# Patient Record
Sex: Female | Born: 1950 | Race: White | Hispanic: No | Marital: Married | Smoking: Former smoker
Health system: Southern US, Community
[De-identification: ages and names within clinical notes are randomized; demographics above are authoritative.]

## PROBLEM LIST (undated history)

## (undated) DIAGNOSIS — T7840XA Allergy, unspecified, initial encounter: Secondary | ICD-10-CM

## (undated) DIAGNOSIS — F419 Anxiety disorder, unspecified: Secondary | ICD-10-CM

## (undated) DIAGNOSIS — M1990 Unspecified osteoarthritis, unspecified site: Secondary | ICD-10-CM

## (undated) DIAGNOSIS — K219 Gastro-esophageal reflux disease without esophagitis: Secondary | ICD-10-CM

## (undated) DIAGNOSIS — I10 Essential (primary) hypertension: Secondary | ICD-10-CM

## (undated) DIAGNOSIS — F32A Depression, unspecified: Secondary | ICD-10-CM

## (undated) HISTORY — DX: Allergy, unspecified, initial encounter: T78.40XA

## (undated) HISTORY — DX: Anxiety disorder, unspecified: F41.9

## (undated) HISTORY — DX: Essential (primary) hypertension: I10

## (undated) HISTORY — DX: Depression, unspecified: F32.A

## (undated) HISTORY — DX: Unspecified osteoarthritis, unspecified site: M19.90

## (undated) HISTORY — DX: Gastro-esophageal reflux disease without esophagitis: K21.9

---

## 1993-01-03 HISTORY — PX: ABDOMINAL HYSTERECTOMY: SHX81

## 2013-01-03 HISTORY — PX: ROTATOR CUFF REPAIR: SHX139

## 2017-08-02 DIAGNOSIS — K219 Gastro-esophageal reflux disease without esophagitis: Secondary | ICD-10-CM | POA: Insufficient documentation

## 2017-08-02 DIAGNOSIS — M25562 Pain in left knee: Secondary | ICD-10-CM | POA: Insufficient documentation

## 2017-08-02 DIAGNOSIS — G8929 Other chronic pain: Secondary | ICD-10-CM | POA: Insufficient documentation

## 2017-08-02 DIAGNOSIS — F419 Anxiety disorder, unspecified: Secondary | ICD-10-CM | POA: Insufficient documentation

## 2017-08-02 DIAGNOSIS — H259 Unspecified age-related cataract: Secondary | ICD-10-CM | POA: Insufficient documentation

## 2017-08-21 LAB — COLOGUARD
Cologuard: NEGATIVE
Cologuard: POSITIVE — AB

## 2017-09-20 LAB — HM HEPATITIS C SCREENING LAB: HM Hepatitis Screen: NEGATIVE

## 2017-09-28 DIAGNOSIS — R590 Localized enlarged lymph nodes: Secondary | ICD-10-CM | POA: Insufficient documentation

## 2017-09-28 DIAGNOSIS — E559 Vitamin D deficiency, unspecified: Secondary | ICD-10-CM | POA: Insufficient documentation

## 2018-09-25 LAB — HM DEXA SCAN

## 2018-09-25 LAB — HEMOGLOBIN A1C: Hemoglobin A1C: 5.3

## 2018-10-02 DIAGNOSIS — E782 Mixed hyperlipidemia: Secondary | ICD-10-CM | POA: Insufficient documentation

## 2018-11-26 LAB — HM MAMMOGRAPHY

## 2019-04-16 DIAGNOSIS — I10 Essential (primary) hypertension: Secondary | ICD-10-CM | POA: Insufficient documentation

## 2019-11-19 ENCOUNTER — Other Ambulatory Visit: Payer: Self-pay | Admitting: Family Medicine

## 2019-11-19 DIAGNOSIS — Z1231 Encounter for screening mammogram for malignant neoplasm of breast: Secondary | ICD-10-CM

## 2019-12-02 ENCOUNTER — Ambulatory Visit: Payer: Self-pay

## 2019-12-02 ENCOUNTER — Encounter: Payer: Self-pay | Admitting: Physician Assistant

## 2019-12-02 ENCOUNTER — Other Ambulatory Visit: Payer: Self-pay

## 2019-12-02 ENCOUNTER — Ambulatory Visit (INDEPENDENT_AMBULATORY_CARE_PROVIDER_SITE_OTHER): Payer: Medicare PPO | Admitting: Physician Assistant

## 2019-12-02 VITALS — BP 120/80 | HR 66 | Temp 97.8°F | Resp 16 | Ht 64.0 in | Wt 189.0 lb

## 2019-12-02 DIAGNOSIS — M25561 Pain in right knee: Secondary | ICD-10-CM | POA: Diagnosis not present

## 2019-12-02 DIAGNOSIS — I10 Essential (primary) hypertension: Secondary | ICD-10-CM

## 2019-12-02 DIAGNOSIS — K219 Gastro-esophageal reflux disease without esophagitis: Secondary | ICD-10-CM

## 2019-12-02 DIAGNOSIS — R0683 Snoring: Secondary | ICD-10-CM

## 2019-12-02 DIAGNOSIS — G8929 Other chronic pain: Secondary | ICD-10-CM

## 2019-12-02 DIAGNOSIS — M25562 Pain in left knee: Secondary | ICD-10-CM

## 2019-12-02 DIAGNOSIS — E782 Mixed hyperlipidemia: Secondary | ICD-10-CM | POA: Diagnosis not present

## 2019-12-02 DIAGNOSIS — F419 Anxiety disorder, unspecified: Secondary | ICD-10-CM

## 2019-12-02 LAB — COMPREHENSIVE METABOLIC PANEL
ALT: 24 U/L (ref 0–35)
AST: 27 U/L (ref 0–37)
Albumin: 4.2 g/dL (ref 3.5–5.2)
Alkaline Phosphatase: 84 U/L (ref 39–117)
BUN: 20 mg/dL (ref 6–23)
CO2: 29 mEq/L (ref 19–32)
Calcium: 9.1 mg/dL (ref 8.4–10.5)
Chloride: 101 mEq/L (ref 96–112)
Creatinine, Ser: 0.89 mg/dL (ref 0.40–1.20)
GFR: 65.36 mL/min (ref >60.00)
Glucose, Bld: 97 mg/dL (ref 70–99)
Potassium: 3.6 mEq/L (ref 3.5–5.1)
Sodium: 140 mEq/L (ref 135–145)
Total Bilirubin: 0.7 mg/dL (ref 0.2–1.2)
Total Protein: 6.6 g/dL (ref 6.0–8.3)

## 2019-12-02 LAB — LIPID PANEL
Cholesterol: 176 mg/dL (ref 0–200)
HDL: 50.6 mg/dL (ref >39.00)
LDL Cholesterol: 95 mg/dL (ref 0–99)
NonHDL: 125.14
Total CHOL/HDL Ratio: 3
Triglycerides: 153 mg/dL — ABNORMAL HIGH (ref 0.0–149.0)
VLDL: 30.6 mg/dL (ref 0.0–40.0)

## 2019-12-02 MED ORDER — CITALOPRAM HYDROBROMIDE 20 MG PO TABS: 20.0000 mg | ORAL_TABLET | Freq: Every day | ORAL | 1 refills | Status: DC

## 2019-12-02 MED ORDER — PRAVASTATIN SODIUM 20 MG PO TABS: 20.0000 mg | ORAL_TABLET | Freq: Every day | ORAL | 1 refills | Status: DC

## 2019-12-02 MED ORDER — OMEPRAZOLE 20 MG PO CPDR: 20.0000 mg | DELAYED_RELEASE_CAPSULE | Freq: Every day | ORAL | 1 refills | Status: DC

## 2019-12-02 MED ORDER — HYDROCHLOROTHIAZIDE 25 MG PO TABS: 25.0000 mg | ORAL_TABLET | Freq: Every day | ORAL | 1 refills | Status: AC

## 2019-12-02 MED ORDER — CELECOXIB 100 MG PO CAPS: 100.0000 mg | ORAL_CAPSULE | Freq: Two times a day (BID) | ORAL | 1 refills | Status: DC

## 2019-12-02 NOTE — Progress Notes (Signed)
Patient presents to clinic today to establish care.  She and her husband recently moved here from the [place].  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  Patient in need of medication refills.  Acute Concerns: Patient endorses ongoing issue with severe snoring.  Notes she snores so loud that her husband would not stay in the same room with her.  Patient and husband both deny apneic episodes or daytime somnolence.  Chronic Issues: Hypertension --patient endorses longstanding history.  Denies any history of heart attack or coronary artery disease.  Is currently on a regimen of hydrochlorothiazide 25 mg daily.  Also takes pravastatin 20 mg daily for history of borderline cholesterol.  Endorses taking as directed and tolerating well. Patient denies chest pain, palpitations, lightheadedness, dizziness, vision changes or frequent headaches.  GAD  --patient currently on a regimen of citalopram 20 mg daily.  Endorses taking as directed and tolerating very well with stable mood and good control of her anxiety symptoms.  Patient states she has come off of this medication in the past but always has to restart due to anxiety levels.  Denies any suicidal thoughts or ideation.  Osteoarthritis of knees --bilateral.  Is followed by orthopedics still in [facility] where she gets injections.  Is on a regimen of meloxicam 7.5 mg daily but has been out of medication.  Is wanting to know if this can be refilled today.  GERD --patient taking omeprazole 20 mg daily.  Notes as long as she takes the medicine she has good relief of symptoms.  Cannot go more than a day without her medication.  Denies any history of peptic ulcer disease.  Denies melena or hematochezia.  Notes is up-to-date on colorectal cancer screening.   Past Medical History:  Diagnosis Date   Allergy    Anxiety    Arthritis    Depression    GERD (gastroesophageal reflux disease)    Hypertension     Past  Surgical History:  Procedure Laterality Date   ABDOMINAL HYSTERECTOMY  1995   ROTATOR CUFF REPAIR  2015    Current Outpatient Medications on File Prior to Visit  Medication Sig Dispense Refill   citalopram (CELEXA) 20 MG tablet Take 1 tablet by mouth daily.     hydrochlorothiazide (HYDRODIURIL) 25 MG tablet Take 25 mg by mouth daily.      loratadine (CLARITIN) 10 MG tablet Take 1 tablet by mouth daily.     meloxicam (MOBIC) 7.5 MG tablet Take 1 tablet by mouth daily.     omeprazole (PRILOSEC) 20 MG capsule Take 1 tablet by mouth daily.     pravastatin (PRAVACHOL) 20 MG tablet Take 20 mg by mouth daily.      No current facility-administered medications on file prior to visit.    No Known Allergies  Family History  Problem Relation Age of Onset   Arthritis Mother    Hypertension Mother    Alcohol abuse Father    Cancer Father    Early death Father    Cirrhosis Father    Arthritis Brother    Hyperlipidemia Brother    Hypertension Brother    Stroke Maternal Grandmother    Heart attack Maternal Grandfather    Heart disease Maternal Grandfather    Stroke Paternal Grandmother    Heart attack Paternal Grandfather     Social History   Socioeconomic History   Marital status: Married    Spouse name: Not on file  Number of children: 1   Years of education: Not on file   Highest education level: Not on file  Occupational History   Occupation: Chaplain    Employer: [employer]  Tobacco Use   Smoking status: Former Smoker    Types: Cigarettes   Smokeless tobacco: Never Used  Scientific laboratory technician Use: Never used  Substance and Sexual Activity   Alcohol use: Yes    Alcohol/week: 2.0 standard drinks    Types: 2 Glasses of wine per week   Drug use: Never   Sexual activity: Not Currently    Birth control/protection: Surgical  Other Topics Concern   Not on file  Social History Narrative   Not on file   Social Determinants of Health    Financial Resource Strain:    Difficulty of Paying Living Expenses: Not on file  Food Insecurity:    Worried About [place] in the Last Year: Not on file   Ran Out of Food in the Last Year: Not on file  Transportation Needs:    Lack of Transportation (Medical): Not on file   Lack of Transportation (Non-Medical): Not on file  Physical Activity:    Days of Exercise per Week: Not on file   Minutes of Exercise per Session: Not on file  Stress:    Feeling of Stress : Not on file  Social Connections:    Frequency of Communication with Friends and Family: Not on file   Frequency of Social Gatherings with Friends and Family: Not on file   Attends Religious Services: Not on file   Active Member of Clubs or Organizations: Not on file   Attends Archivist Meetings: Not on file   Marital Status: Not on file  Intimate Partner Violence:    Fear of Current or Ex-Partner: Not on file   Emotionally Abused: Not on file   Physically Abused: Not on file   Sexually Abused: Not on file   ROS pertinent ROS are listed in the HPI.  BP 120/80    Pulse 66    Temp 97.8 F (36.6 C) (Temporal)    Resp 16    Ht 5' 4"  (1.626 m)    Wt 189 lb (85.7 kg)    SpO2 99%    BMI 32.44 kg/m   Physical Exam Vitals reviewed.  Constitutional:      Appearance: Normal appearance.  HENT:     Head: Normocephalic and atraumatic.  Cardiovascular:     Rate and Rhythm: Normal rate and regular rhythm.     Pulses: Normal pulses.     Heart sounds: Normal heart sounds.  Pulmonary:     Effort: Pulmonary effort is normal.     Breath sounds: Normal breath sounds.  Musculoskeletal:     Cervical back: Neck supple.  Neurological:     General: No focal deficit present.     Mental Status: She is alert.  Psychiatric:        Mood and Affect: Mood normal.    Assessment/Plan: 1. Essential hypertension BP normotensive.  Asymptomatic.  Will refill hydrochlorothiazide 25 mg daily.  Will  obtain fasting lipid panel and CMP today. - hydrochlorothiazide (HYDRODIURIL) 25 MG tablet; Take 1 tablet (25 mg total) by mouth daily.  Dispense: 90 tablet; Refill: 1 - Comp Met (CMET) - Lipid Profile  2. Mixed hyperlipidemia Taking statin as directed.  Dietary and exercise recommendations reviewed.  1 year since last cholesterol check.  Order placed. - pravastatin (PRAVACHOL)  20 MG tablet; Take 1 tablet (20 mg total) by mouth daily.  Dispense: 90 tablet; Refill: 1 - Lipid Profile  3. Gastroesophageal reflux disease without esophagitis Controlled with current regimen.  Suspect that the meloxicam has been contributing to her GERD symptoms.  We have switched to Celebrex.  Hopefully will be able to reduce dosing of her omeprazole.  Follow-up scheduled. - omeprazole (PRILOSEC) 20 MG capsule; Take 1 capsule (20 mg total) by mouth daily.  Dispense: 90 capsule; Refill: 1  4. Chronic pain of both knees Stop meloxicam.  Rx Celebrex 100 mg twice daily.  Supportive measures discussed.  Follow-up scheduled.  5. Anxiety Doing well on current regimen.  Medications refilled.  Follow-up every 6 months. - citalopram (CELEXA) 20 MG tablet; Take 1 tablet (20 mg total) by mouth daily.  Dispense: 90 tablet; Refill: 1  6. Snoring Without any noted apneic episodes.  We will first refer to ENT for evaluation.  If unremarkable will need sleep study.  Patient voiced understanding and agreement with the plan. - Ambulatory referral to ENT  This visit occurred during the SARS-CoV-2 public health emergency.  Safety protocols were in place, including screening questions prior to the visit, additional usage of staff PPE, and extensive cleaning of exam room while observing appropriate contact time as indicated for disinfecting solutions.     Leeanne Rio, PA-C

## 2019-12-02 NOTE — Patient Instructions (Signed)
Please go to the lab today for blood work.  I will call you with your results. We will alter treatment regimen(s) if indicated by your results.   I have sent in refills of your medication with the following exception:  - We are stopping the Meloxicam and switching to the Celebrex instead as this is better on your system.   I will get prior records to review. Schedule your CPE at your earliest convenience.   It was very nice meeting you today. Welcome to [facility]!

## 2020-01-10 ENCOUNTER — Other Ambulatory Visit: Payer: Self-pay

## 2020-01-10 ENCOUNTER — Ambulatory Visit (INDEPENDENT_AMBULATORY_CARE_PROVIDER_SITE_OTHER): Payer: Medicare PPO | Admitting: Otolaryngology

## 2020-01-10 ENCOUNTER — Encounter (INDEPENDENT_AMBULATORY_CARE_PROVIDER_SITE_OTHER): Payer: Self-pay | Admitting: Otolaryngology

## 2020-01-10 VITALS — Temp 96.4°F

## 2020-01-10 DIAGNOSIS — R0683 Snoring: Secondary | ICD-10-CM | POA: Diagnosis not present

## 2020-01-10 DIAGNOSIS — J310 Chronic rhinitis: Secondary | ICD-10-CM | POA: Diagnosis not present

## 2020-01-10 NOTE — Progress Notes (Addendum)
HPI: Diane Harris is a 70 y.o. female who presents is referred by her PCP for evaluation of snoring.  She denies trouble sleeping at night.  She feels like she gets a good night sleep and is not tired during the daytime.  However she apparently snores badly to the degree that her husband has not slept with her for the past 2 to 3 years.  Over the past 2 years she has gained about 20 to 25 pounds of weight.  She does have history of allergies and intermittent nasal congestion.  She uses Flonase intermittently.  She has used Claritin regularly..  She presents here to discuss any options for her snoring.  Past Medical History:  Diagnosis Date  . Allergy   . Anxiety   . Arthritis   . Depression   . GERD (gastroesophageal reflux disease)   . Hypertension    Past Surgical History:  Procedure Laterality Date  . ABDOMINAL HYSTERECTOMY  1995  . ROTATOR CUFF REPAIR  2015   Social History   Socioeconomic History  . Marital status: Married    Spouse name: Not on file  . Number of children: 1  . Years of education: Not on file  . Highest education level: Not on file  Occupational History  . Occupation: Physicist, medical: [employer]  Tobacco Use  . Smoking status: Former Smoker    Packs/day: 0.50    Years: 15.00    Pack years: 7.50    Types: Cigarettes  . Smokeless tobacco: Never Used  Vaping Use  . Vaping Use: Never used  Substance and Sexual Activity  . Alcohol use: Yes    Alcohol/week: 2.0 standard drinks    Types: 2 Glasses of wine per week  . Drug use: Never  . Sexual activity: Not Currently    Birth control/protection: Surgical  Other Topics Concern  . Not on file  Social History Narrative  . Not on file   Social Determinants of Health   Financial Resource Strain: Not on file  Food Insecurity: Not on file  Transportation Needs: Not on file  Physical Activity: Not on file  Stress: Not on file  Social Connections: Not on file   Family History  Problem Relation  Age of Onset  . Arthritis Mother   . Hypertension Mother   . Alcohol abuse Father   . Cancer Father   . Early death Father   . Cirrhosis Father   . Arthritis Brother   . Hyperlipidemia Brother   . Hypertension Brother   . Stroke Maternal Grandmother   . Heart attack Maternal Grandfather   . Heart disease Maternal Grandfather   . Stroke Paternal Grandmother   . Heart attack Paternal Grandfather    No Known Allergies Prior to Admission medications   Medication Sig Start Date End Date Taking? Authorizing Provider  celecoxib (CELEBREX) 100 MG capsule Take 1 capsule (100 mg total) by mouth 2 (two) times daily. 12/02/19   Waldon Merl, PA-C  citalopram (CELEXA) 20 MG tablet Take 1 tablet (20 mg total) by mouth daily. 12/02/19   Waldon Merl, PA-C  hydrochlorothiazide (HYDRODIURIL) 25 MG tablet Take 1 tablet (25 mg total) by mouth daily. 12/02/19   Waldon Merl, PA-C  loratadine (CLARITIN) 10 MG tablet Take 1 tablet by mouth daily. 11/18/19   [provider]  omeprazole (PRILOSEC) 20 MG capsule Take 1 capsule (20 mg total) by mouth daily. 12/02/19   Waldon Merl, PA-C  pravastatin (PRAVACHOL) 20 MG tablet Take 1 tablet (20 mg total) by mouth daily. 12/02/19   Waldon Merl, PA-C     Positive ROS: Otherwise negative  All other systems have been reviewed and were otherwise negative with the exception of those mentioned in the HPI and as above.  Physical Exam: Constitutional: Alert, well-appearing, no acute distress Ears: External ears without lesions or tenderness. Ear canals are clear bilaterally with intact, clear TMs.  Nasal: External nose without lesions. Septum mildly deviated with moderate rhinitis.  After decongesting the nose there are no polyps or obstructing lesions noted within the nasal cavity.  She has mild nasal congestion.  Both middle meatus regions are clear..  Oral: Lips and gums without lesions. Tongue and palate mucosa without lesions.  Posterior oropharynx clear.  Of note she has a strong gag reflex with a large tongue base.  Tonsils and uvula minimal size. Neck: No palpable adenopathy or masses Respiratory: Breathing comfortably  Skin: No facial/neck lesions or rash noted.  Procedures  Assessment: Snoring with no clinical symptoms of obstructive sleep apnea Chronic rhinitis  Plan: Discussed with her concerning initially use of Afrin and Breathe Right strips to maximize the nasal passages to see if this helps with snoring.  If this is effective at reducing snoring could consider surgical intervention which would involve septoplasty and turbinate reductions. Also was briefly discussed possible split or mandibular repositioning to help with snoring as she does have a large tongue base and strong gag reflex and gave her the name of Dr. Irene Limbo Recommended regular use of either Nasacort or Flonase 2 sprays each nostril at night as this will help with some nasal congestion. Also discussed that weight loss would improve snoring.   Narda Bonds, MD   CC:

## 2020-01-16 ENCOUNTER — Ambulatory Visit: Payer: Self-pay

## 2020-01-23 ENCOUNTER — Encounter: Payer: Self-pay | Admitting: Physician Assistant

## 2020-01-24 ENCOUNTER — Ambulatory Visit: Payer: Medicare PPO | Admitting: Physician Assistant

## 2020-01-24 ENCOUNTER — Encounter: Payer: Self-pay | Admitting: Physician Assistant

## 2020-01-24 ENCOUNTER — Other Ambulatory Visit: Payer: Self-pay

## 2020-01-24 ENCOUNTER — Ambulatory Visit (INDEPENDENT_AMBULATORY_CARE_PROVIDER_SITE_OTHER): Payer: Medicare PPO

## 2020-01-24 VITALS — BP 120/80 | HR 76 | Temp 97.8°F | Resp 16 | Ht 64.0 in | Wt 192.0 lb

## 2020-01-24 DIAGNOSIS — R600 Localized edema: Secondary | ICD-10-CM | POA: Diagnosis not present

## 2020-01-24 DIAGNOSIS — M79662 Pain in left lower leg: Secondary | ICD-10-CM

## 2020-01-24 DIAGNOSIS — M7989 Other specified soft tissue disorders: Secondary | ICD-10-CM

## 2020-01-24 NOTE — Progress Notes (Signed)
Patient presents to clinic today c/o pain and discomfort in her left calf muscle over the past several days.  Notes is more lateral and is worse at night when she is laying in bed.  Has noted some mild swelling.  Does note chronic knee pain bilaterally but denies any exacerbation of this.  Denies any recent prolonged immobilization or history of blood clot.  Denies any noted skin changes.  Has been taking Celebrex with some improvement in her symptoms.  Past Medical History:  Diagnosis Date  . Allergy   . Anxiety   . Arthritis   . Depression   . GERD (gastroesophageal reflux disease)   . Hypertension     Current Outpatient Medications on File Prior to Visit  Medication Sig Dispense Refill  . celecoxib (CELEBREX) 100 MG capsule Take 1 capsule (100 mg total) by mouth 2 (two) times daily. 60 capsule 1  . citalopram (CELEXA) 20 MG tablet Take 1 tablet (20 mg total) by mouth daily. 90 tablet 1  . hydrochlorothiazide (HYDRODIURIL) 25 MG tablet Take 1 tablet (25 mg total) by mouth daily. 90 tablet 1  . loratadine (CLARITIN) 10 MG tablet Take 1 tablet by mouth daily.    Marland Kitchen omeprazole (PRILOSEC) 20 MG capsule Take 1 capsule (20 mg total) by mouth daily. 90 capsule 1  . pravastatin (PRAVACHOL) 20 MG tablet Take 1 tablet (20 mg total) by mouth daily. 90 tablet 1   No current facility-administered medications on file prior to visit.    No Known Allergies  Family History  Problem Relation Age of Onset  . Arthritis Mother   . Hypertension Mother   . Alcohol abuse Father   . Cancer Father   . Early death Father   . Cirrhosis Father   . Arthritis Brother   . Hyperlipidemia Brother   . Hypertension Brother   . Stroke Maternal Grandmother   . Heart attack Maternal Grandfather   . Heart disease Maternal Grandfather   . Stroke Paternal Grandmother   . Heart attack Paternal Grandfather     Social History   Socioeconomic History  . Marital status: Married    Spouse name: Not on file  .  Number of children: 1  . Years of education: Not on file  . Highest education level: Not on file  Occupational History  . Occupation: Scientist, physiological: [employer]  Tobacco Use  . Smoking status: Former Smoker    Packs/day: 0.50    Years: 15.00    Pack years: 7.50    Types: Cigarettes  . Smokeless tobacco: Never Used  Vaping Use  . Vaping Use: Never used  Substance and Sexual Activity  . Alcohol use: Yes    Alcohol/week: 2.0 standard drinks    Types: 2 Glasses of wine per week  . Drug use: Never  . Sexual activity: Not Currently    Birth control/protection: Surgical  Other Topics Concern  . Not on file  Social History Narrative  . Not on file   Social Determinants of Health   Financial Resource Strain: Not on file  Food Insecurity: Not on file  Transportation Needs: Not on file  Physical Activity: Not on file  Stress: Not on file  Social Connections: Not on file   Review of Systems - See HPI.  All other ROS are negative.  BP 120/80   Pulse 76   Temp 97.8 F (36.6 C) (Temporal)   Resp 16   Ht 5' 4"  (1.626 m)  Wt 192 lb (87.1 kg)   SpO2 98%   BMI 32.96 kg/m   Physical Exam Vitals reviewed.  Constitutional:      Appearance: Normal appearance.  HENT:     Head: Normocephalic and atraumatic.  Cardiovascular:     Rate and Rhythm: Normal rate and regular rhythm.     Pulses: Normal pulses.     Heart sounds: Normal heart sounds.     Comments: Left calf tenderness. Negative Homan sign. Multiple spider veins and varicosities noted on visual exam with LLE > RLE Pulmonary:     Effort: Pulmonary effort is normal.     Breath sounds: Normal breath sounds.  Musculoskeletal:     Cervical back: Neck supple.     Right lower leg: 1+ Pitting Edema present.     Left lower leg: 1+ Pitting Edema present.     Left ankle: Normal.  Neurological:     Mental Status: She is alert.  Psychiatric:        Mood and Affect: Mood normal.     Recent Results (from the past 2160  hour(s))  Comp Met (CMET)     Status: None   Collection Time: 12/02/19 11:32 AM  Result Value Ref Range   Sodium 140 135 - 145 mEq/L   Potassium 3.6 3.5 - 5.1 mEq/L   Chloride 101 96 - 112 mEq/L   CO2 29 19 - 32 mEq/L   Glucose, Bld 97 70 - 99 mg/dL   BUN 20 6 - 23 mg/dL   Creatinine, Ser 0.89 0.40 - 1.20 mg/dL   Total Bilirubin 0.7 0.2 - 1.2 mg/dL   Alkaline Phosphatase 84 39 - 117 U/L   AST 27 0 - 37 U/L   ALT 24 0 - 35 U/L   Total Protein 6.6 6.0 - 8.3 g/dL   Albumin 4.2 3.5 - 5.2 g/dL   GFR 65.36 >60.00 mL/min    Comment: Calculated using the CKD-EPI Creatinine Equation (2021)   Calcium 9.1 8.4 - 10.5 mg/dL  Lipid Profile     Status: Abnormal   Collection Time: 12/02/19 11:32 AM  Result Value Ref Range   Cholesterol 176 0 - 200 mg/dL    Comment: ATP III Classification       Desirable:  < 200 mg/dL               Borderline High:  200 - 239 mg/dL          High:  > = 240 mg/dL   Triglycerides 153.0 (H) 0.0 - 149.0 mg/dL    Comment: Normal:  <150 mg/dLBorderline High:  150 - 199 mg/dL   HDL 50.60 >39.00 mg/dL   VLDL 30.6 0.0 - 40.0 mg/dL   LDL Cholesterol 95 0 - 99 mg/dL   Total CHOL/HDL Ratio 3     Comment:                Men          Women1/2 Average Risk     3.4          3.3Average Risk          5.0          4.42X Average Risk          9.6          7.13X Average Risk          15.0          11.0  NonHDL 125.14     Comment: NOTE:  Non-HDL goal should be 30 mg/dL higher than patient's LDL goal (i.e. LDL goal of < 70 mg/dL, would have non-HDL goal of < 100 mg/dL)   Assessment/Plan: 1. Pain and swelling of left lower leg Examination significant for spider veins and varicosities noted bilaterally, left greater than right.  Suspect venous insufficiency is cause of her symptoms but want to rule out clot.  Will obtain ultrasound to rule out DVT.  If positive will treat accordingly.  If negative will plan for compression stockings, low-salt diet and consideration of  fluid pill versus vascular surgery assessment. - US Venous Img Lower Unilateral Left; Future  This visit occurred during the SARS-CoV-2 public health emergency.  Safety protocols were in place, including screening questions prior to the visit, additional usage of staff PPE, and extensive cleaning of exam room while observing appropriate contact time as indicated for disinfecting solutions.     Leeanne Rio, PA-C

## 2020-01-24 NOTE — Patient Instructions (Addendum)
Keep legs elevated while resting.  Keep a low salt diet.  You can use OTC Tylenol arthritis along with the celebrex.  Speak with Alcario Drought about your Ultrasound. We want to r/o clot first. If positive we will start appropriate treatment. If negative suspect symptoms combination of venous insufficiency and muscular tension and will treat accordingly.   They should call me with your [place] results before they let you leave the imaging center.   Hang in there!

## 2020-02-03 ENCOUNTER — Other Ambulatory Visit: Payer: Self-pay | Admitting: Physician Assistant

## 2020-02-06 ENCOUNTER — Telehealth (INDEPENDENT_AMBULATORY_CARE_PROVIDER_SITE_OTHER): Payer: Medicare PPO | Admitting: Family Medicine

## 2020-02-06 DIAGNOSIS — M79606 Pain in leg, unspecified: Secondary | ICD-10-CM | POA: Diagnosis not present

## 2020-02-06 NOTE — Progress Notes (Signed)
Virtual Visit via Video Note  I connected with Diane Harris  on 02/06/20 at 12:20 PM EST by a video enabled telemedicine application and verified that I am speaking with the correct person using two identifiers.  Location patient: home, [name] Location provider:work or home office Persons participating in the virtual visit: patient, provider  I discussed the limitations of evaluation and management by telemedicine and the availability of in person appointments. The patient expressed understanding and agreed to proceed.   HPI:  Acute telemedicine visit for L leg pain: -Onset: 2-3 weeks ago -has a history of bad knees and OA and baker's cyst - used to see a sports medicine in [place] -Symptoms include:dull achy deep pain L lower lateral leg and L ankle - different from any pain she has felt in the past and is "scary" for her -saw PCP and had [place] which r/o DVT and venous issues -Denies:redness, swelling, warmth, hx of trauma or change in footwear, change in activity level, weakness, numbness, malaise or fevers, sensation to move, cramps -getting out of car and up stairs is the worst -hurts more when in bed, less once up and around -Has tried: takes celebrex for her knees and tylenol and that helps minimally  ROS: See pertinent positives and negatives per HPI.  Past Medical History:  Diagnosis Date  . Allergy   . Anxiety   . Arthritis   . Depression   . GERD (gastroesophageal reflux disease)   . Hypertension     Past Surgical History:  Procedure Laterality Date  . ABDOMINAL HYSTERECTOMY  1995  . ROTATOR CUFF REPAIR  2015     Current Outpatient Medications:  .  celecoxib (CELEBREX) 100 MG capsule, TAKE 1 CAPSULE BY MOUTH TWICE A DAY, Disp: 60 capsule, Rfl: 1 .  citalopram (CELEXA) 20 MG tablet, Take 1 tablet (20 mg total) by mouth daily., Disp: 90 tablet, Rfl: 1 .  hydrochlorothiazide (HYDRODIURIL) 25 MG tablet, Take 1 tablet (25 mg total) by mouth daily., Disp: 90 tablet, Rfl: 1 .   loratadine (CLARITIN) 10 MG tablet, Take 1 tablet by mouth daily., Disp: , Rfl:  .  omeprazole (PRILOSEC) 20 MG capsule, Take 1 capsule (20 mg total) by mouth daily., Disp: 90 capsule, Rfl: 1 .  pravastatin (PRAVACHOL) 20 MG tablet, Take 1 tablet (20 mg total) by mouth daily., Disp: 90 tablet, Rfl: 1  EXAM:  VITALS per patient if applicable:  GENERAL: alert, oriented, appears well and in no acute distress  HEENT: atraumatic, conjunttiva clear, no obvious abnormalities on inspection of external nose and ears  NECK: normal movements of the head and neck  LUNGS: on inspection no signs of respiratory distress, breathing rate appears normal, no obvious gross SOB, gasping or wheezing  CV: no obvious cyanosis  MS: moves all visible extremities without noticeable abnormality  PSYCH/NEURO: pleasant and cooperative, no obvious depression or anxiety, speech and thought processing grossly intact  ASSESSMENT AND PLAN:  Discussed the following assessment and plan:  Pain of lower extremity, unspecified laterality  -we discussed possible serious and likely etiologies, options for evaluation and workup, limitations of telemedicine visit vs in person visit, treatment, treatment risks and precautions. Pt prefers to treat via telemedicine empirically rather than in person at this moment.  After lengthy discussion,  she has opted for evaluation with an orthopedic specialist.  She is using a Celebrex and Tylenol for pain.  She decided to try an orthopedic urgent care today, rather than wait on a referral.  Discussed various  options and she agrees to seek a visit on her own. Scheduled follow up with PCP offered: Agrees to call her PCP office if follow-up as needed after evaluation with Ortho. Advised to seek prompt in person care if worsening, new symptoms arise, or if is not improving with treatment. Discussed options for inperson care if PCP office not available. Did let this patient know that I only do  telemedicine on Tuesdays and Thursdays for [place]. Advised to schedule follow up visit with PCP or [facility] if any further questions or concerns to avoid delays in care.   I discussed the assessment and treatment plan with the patient. The patient was provided an opportunity to ask questions and all were answered. The patient agreed with the plan and demonstrated an understanding of the instructions.     Terressa Koyanagi, DO

## 2020-03-29 ENCOUNTER — Other Ambulatory Visit: Payer: Self-pay | Admitting: Family Medicine

## 2020-06-03 ENCOUNTER — Other Ambulatory Visit: Payer: Self-pay

## 2020-06-03 DIAGNOSIS — F419 Anxiety disorder, unspecified: Secondary | ICD-10-CM

## 2020-06-03 MED ORDER — CELECOXIB 100 MG PO CAPS: 100.0000 mg | ORAL_CAPSULE | Freq: Two times a day (BID) | ORAL | 0 refills | Status: AC

## 2020-06-11 ENCOUNTER — Other Ambulatory Visit: Payer: Self-pay

## 2020-06-11 DIAGNOSIS — F419 Anxiety disorder, unspecified: Secondary | ICD-10-CM

## 2020-06-11 DIAGNOSIS — K219 Gastro-esophageal reflux disease without esophagitis: Secondary | ICD-10-CM

## 2020-06-11 MED ORDER — OMEPRAZOLE 20 MG PO CPDR: 20.0000 mg | DELAYED_RELEASE_CAPSULE | Freq: Every day | ORAL | 0 refills | Status: AC

## 2020-06-11 MED ORDER — CITALOPRAM HYDROBROMIDE 20 MG PO TABS
20.0000 mg | ORAL_TABLET | Freq: Every day | ORAL | 0 refills | Status: AC
Start: 2020-06-11 — End: ?

## 2020-06-30 ENCOUNTER — Other Ambulatory Visit: Payer: Self-pay

## 2020-06-30 ENCOUNTER — Inpatient Hospital Stay (HOSPITAL_COMMUNITY)
Admission: EM | Admit: 2020-06-30 | Discharge: 2020-07-02 | DRG: 103 | Disposition: A | Discharge status: Home / Self Care | Payer: Medicare PPO | Attending: Neurology | Admitting: Neurology

## 2020-06-30 ENCOUNTER — Inpatient Hospital Stay (HOSPITAL_COMMUNITY): Payer: Medicare PPO

## 2020-06-30 ENCOUNTER — Emergency Department (HOSPITAL_COMMUNITY): Payer: Medicare PPO

## 2020-06-30 ENCOUNTER — Encounter (HOSPITAL_COMMUNITY): Payer: Self-pay | Admitting: Emergency Medicine

## 2020-06-30 DIAGNOSIS — F32A Depression, unspecified: Secondary | ICD-10-CM | POA: Diagnosis present

## 2020-06-30 DIAGNOSIS — D72829 Elevated white blood cell count, unspecified: Secondary | ICD-10-CM | POA: Diagnosis present

## 2020-06-30 DIAGNOSIS — Z79899 Other long term (current) drug therapy: Secondary | ICD-10-CM

## 2020-06-30 DIAGNOSIS — R4701 Aphasia: Secondary | ICD-10-CM

## 2020-06-30 DIAGNOSIS — Z83438 Family history of other disorder of lipoprotein metabolism and other lipidemia: Secondary | ICD-10-CM

## 2020-06-30 DIAGNOSIS — G43109 Migraine with aura, not intractable, without status migrainosus: Secondary | ICD-10-CM | POA: Diagnosis not present

## 2020-06-30 DIAGNOSIS — Z87891 Personal history of nicotine dependence: Secondary | ICD-10-CM | POA: Diagnosis not present

## 2020-06-30 DIAGNOSIS — Z9282 Status post administration of tPA (rtPA) in a different facility within the last 24 hours prior to admission to current facility: Secondary | ICD-10-CM | POA: Diagnosis not present

## 2020-06-30 DIAGNOSIS — K219 Gastro-esophageal reflux disease without esophagitis: Secondary | ICD-10-CM | POA: Diagnosis present

## 2020-06-30 DIAGNOSIS — R479 Unspecified speech disturbances: Secondary | ICD-10-CM | POA: Diagnosis not present

## 2020-06-30 DIAGNOSIS — Z20822 Contact with and (suspected) exposure to covid-19: Secondary | ICD-10-CM | POA: Diagnosis present

## 2020-06-30 DIAGNOSIS — E782 Mixed hyperlipidemia: Secondary | ICD-10-CM | POA: Diagnosis present

## 2020-06-30 DIAGNOSIS — Z823 Family history of stroke: Secondary | ICD-10-CM | POA: Diagnosis not present

## 2020-06-30 DIAGNOSIS — F419 Anxiety disorder, unspecified: Secondary | ICD-10-CM | POA: Diagnosis present

## 2020-06-30 DIAGNOSIS — I639 Cerebral infarction, unspecified: Secondary | ICD-10-CM | POA: Diagnosis not present

## 2020-06-30 DIAGNOSIS — E7800 Pure hypercholesterolemia, unspecified: Secondary | ICD-10-CM | POA: Diagnosis not present

## 2020-06-30 DIAGNOSIS — I10 Essential (primary) hypertension: Secondary | ICD-10-CM | POA: Diagnosis present

## 2020-06-30 DIAGNOSIS — Z8261 Family history of arthritis: Secondary | ICD-10-CM | POA: Diagnosis not present

## 2020-06-30 DIAGNOSIS — Z8249 Family history of ischemic heart disease and other diseases of the circulatory system: Secondary | ICD-10-CM | POA: Diagnosis not present

## 2020-06-30 DIAGNOSIS — I350 Nonrheumatic aortic (valve) stenosis: Secondary | ICD-10-CM | POA: Diagnosis not present

## 2020-06-30 LAB — RAPID URINE DRUG SCREEN, HOSP PERFORMED
Amphetamines: NOT DETECTED
Barbiturates: NOT DETECTED
Benzodiazepines: NOT DETECTED
Cocaine: NOT DETECTED
Opiates: NOT DETECTED
Tetrahydrocannabinol: NOT DETECTED

## 2020-06-30 LAB — I-STAT CHEM 8, ED
BUN: 12 mg/dL (ref 8–23)
Calcium, Ion: 1.07 mmol/L — ABNORMAL LOW (ref 1.15–1.40)
Chloride: 101 mmol/L (ref 98–111)
Creatinine, Ser: 0.8 mg/dL (ref 0.44–1.00)
Glucose, Bld: 84 mg/dL (ref 70–99)
HCT: 37 % (ref 36.0–46.0)
Hemoglobin: 12.6 g/dL (ref 12.0–15.0)
Potassium: 3 mmol/L — ABNORMAL LOW (ref 3.5–5.1)
Sodium: 137 mmol/L (ref 135–145)
TCO2: 28 mmol/L (ref 22–32)

## 2020-06-30 LAB — URINALYSIS, ROUTINE W REFLEX MICROSCOPIC
Bacteria, UA: NONE SEEN
Bilirubin Urine: NEGATIVE
Glucose, UA: NEGATIVE mg/dL
Hgb urine dipstick: NEGATIVE
Ketones, ur: NEGATIVE mg/dL
Nitrite: NEGATIVE
Protein, ur: NEGATIVE mg/dL
Specific Gravity, Urine: 1.032 — ABNORMAL HIGH (ref 1.005–1.030)
pH: 6 (ref 5.0–8.0)

## 2020-06-30 LAB — RESP PANEL BY RT-PCR (FLU A&B, COVID) ARPGX2
Influenza A by PCR: NEGATIVE
Influenza B by PCR: NEGATIVE
SARS Coronavirus 2 by RT PCR: NEGATIVE

## 2020-06-30 LAB — DIFFERENTIAL
Abs Immature Granulocytes: 0.11 10*3/uL — ABNORMAL HIGH (ref 0.00–0.07)
Basophils Absolute: 0.1 10*3/uL (ref 0.0–0.1)
Basophils Relative: 1 %
Eosinophils Absolute: 0.3 10*3/uL (ref 0.0–0.5)
Eosinophils Relative: 2 %
Immature Granulocytes: 1 %
Lymphocytes Relative: 20 %
Lymphs Abs: 2.5 10*3/uL (ref 0.7–4.0)
Monocytes Absolute: 0.9 10*3/uL (ref 0.1–1.0)
Monocytes Relative: 7 %
Neutro Abs: 8.4 10*3/uL — ABNORMAL HIGH (ref 1.7–7.7)
Neutrophils Relative %: 69 %

## 2020-06-30 LAB — CBG MONITORING, ED: Glucose-Capillary: 85 mg/dL (ref 70–99)

## 2020-06-30 LAB — COMPREHENSIVE METABOLIC PANEL
ALT: 22 U/L (ref 0–44)
AST: 22 U/L (ref 15–41)
Albumin: 4 g/dL (ref 3.5–5.0)
Alkaline Phosphatase: 96 U/L (ref 38–126)
Anion gap: 13 (ref 5–15)
BUN: 10 mg/dL (ref 8–23)
CO2: 25 mmol/L (ref 22–32)
Calcium: 9.1 mg/dL (ref 8.9–10.3)
Chloride: 98 mmol/L (ref 98–111)
Creatinine, Ser: 0.8 mg/dL (ref 0.44–1.00)
GFR, Estimated: >60 mL/min (ref >60)
Glucose, Bld: 84 mg/dL (ref 70–99)
Potassium: 3 mmol/L — ABNORMAL LOW (ref 3.5–5.1)
Sodium: 136 mmol/L (ref 135–145)
Total Bilirubin: 0.9 mg/dL (ref 0.3–1.2)
Total Protein: 6.7 g/dL (ref 6.5–8.1)

## 2020-06-30 LAB — CBC
HCT: 40.1 % (ref 36.0–46.0)
Hemoglobin: 12.6 g/dL (ref 12.0–15.0)
MCH: 26.4 pg (ref 26.0–34.0)
MCHC: 31.4 g/dL (ref 30.0–36.0)
MCV: 83.9 fL (ref 80.0–100.0)
Platelets: 244 10*3/uL (ref 150–400)
RBC: 4.78 MIL/uL (ref 3.87–5.11)
RDW: 13.9 % (ref 11.5–15.5)
WBC: 12.2 10*3/uL — ABNORMAL HIGH (ref 4.0–10.5)
nRBC: 0 % (ref 0.0–0.2)

## 2020-06-30 LAB — PROTIME-INR
INR: 0.9 (ref 0.8–1.2)
Prothrombin Time: 12.5 seconds (ref 11.4–15.2)

## 2020-06-30 LAB — APTT: aPTT: 31 seconds (ref 24–36)

## 2020-06-30 LAB — ETHANOL: Alcohol, Ethyl (B): <10 mg/dL (ref <10)

## 2020-06-30 MED ORDER — PRAVASTATIN SODIUM 10 MG PO TABS
20.0000 mg | ORAL_TABLET | Freq: Every day | ORAL | Status: DC
  Administered 2020-07-01: 20 mg via ORAL
  Filled 2020-06-30: qty 2

## 2020-06-30 MED ORDER — CHLORHEXIDINE GLUCONATE CLOTH 2 % EX PADS
6.0000 | MEDICATED_PAD | Freq: Every day | CUTANEOUS | Status: DC
  Administered 2020-07-01: 6 via TOPICAL

## 2020-06-30 MED ORDER — ACETAMINOPHEN 160 MG/5ML PO SOLN: 650.0000 mg | ORAL | Status: DC | PRN

## 2020-06-30 MED ORDER — ACETAMINOPHEN 325 MG PO TABS
650.0000 mg | ORAL_TABLET | ORAL | Status: DC | PRN
  Administered 2020-07-01 – 2020-07-02 (×3): 650 mg via ORAL
  Filled 2020-06-30 (×3): qty 2

## 2020-06-30 MED ORDER — IOHEXOL 350 MG/ML SOLN
100.0000 mL | Freq: Once | INTRAVENOUS | Status: AC | PRN
  Administered 2020-06-30: 100 mL via INTRAVENOUS

## 2020-06-30 MED ORDER — ALTEPLASE (STROKE) FULL DOSE INFUSION
0.9000 mg/kg | Freq: Once | INTRAVENOUS | Status: AC
  Administered 2020-06-30: 83.7 mg via INTRAVENOUS

## 2020-06-30 MED ORDER — LABETALOL HCL 5 MG/ML IV SOLN
INTRAVENOUS | Status: AC
  Administered 2020-06-30: 10 mg via INTRAVENOUS
  Filled 2020-06-30: qty 4

## 2020-06-30 MED ORDER — ONDANSETRON HCL 4 MG/2ML IJ SOLN
4.0000 mg | Freq: Once | INTRAMUSCULAR | Status: AC
  Administered 2020-06-30: 4 mg via INTRAVENOUS
  Filled 2020-06-30: qty 2

## 2020-06-30 MED ORDER — SODIUM CHLORIDE 0.9 % IV SOLN: INTRAVENOUS | Status: DC

## 2020-06-30 MED ORDER — LABETALOL HCL 5 MG/ML IV SOLN: 10.0000 mg | INTRAVENOUS | Status: DC | PRN

## 2020-06-30 MED ORDER — HYDROCHLOROTHIAZIDE 25 MG PO TABS
25.0000 mg | ORAL_TABLET | Freq: Every day | ORAL | Status: DC
  Administered 2020-07-01: 25 mg via ORAL
  Filled 2020-06-30: qty 1

## 2020-06-30 MED ORDER — STROKE: EARLY STAGES OF RECOVERY BOOK: Freq: Once | Status: DC

## 2020-06-30 MED ORDER — LABETALOL HCL 5 MG/ML IV SOLN: 10.0000 mg | Freq: Once | INTRAVENOUS | Status: AC

## 2020-06-30 MED ORDER — PANTOPRAZOLE SODIUM 40 MG IV SOLR: 40.0000 mg | Freq: Every day | INTRAVENOUS | Status: DC

## 2020-06-30 MED ORDER — CITALOPRAM HYDROBROMIDE 10 MG PO TABS
20.0000 mg | ORAL_TABLET | Freq: Every day | ORAL | Status: DC
  Administered 2020-07-01: 20 mg via ORAL
  Filled 2020-06-30: qty 2

## 2020-06-30 MED ORDER — ACETAMINOPHEN 650 MG RE SUPP: 650.0000 mg | RECTAL | Status: DC | PRN

## 2020-06-30 MED ORDER — SENNOSIDES-DOCUSATE SODIUM 8.6-50 MG PO TABS: 1.0000 | ORAL_TABLET | Freq: Every evening | ORAL | Status: DC | PRN

## 2020-06-30 MED ORDER — LORATADINE 10 MG PO TABS
10.0000 mg | ORAL_TABLET | Freq: Every day | ORAL | Status: DC
  Administered 2020-07-01: 10 mg via ORAL
  Filled 2020-06-30: qty 1

## 2020-06-30 MED ORDER — SODIUM CHLORIDE 0.9 % IV SOLN
50.0000 mL | Freq: Once | INTRAVENOUS | Status: AC
  Administered 2020-06-30: 50 mL via INTRAVENOUS

## 2020-06-30 NOTE — ED Triage Notes (Signed)
Pt presents to ED POV. Pt c/o aphasia. Pt reports that arpox 1730 pt began not being able to say words that she knew. Pt noncompliant on bp med x4d. AAO x4. NIHH - 1

## 2020-06-30 NOTE — ED Notes (Signed)
Pt transferred to MRI

## 2020-06-30 NOTE — H&P (Signed)
Neurology H&P  CC: Difficulty speaking  History is obtained from: Patient and chart review   HPI: Diane Harris is a 70 y.o. woman with a PMHx significant for hypertension, anxiety/depression.  She works here as a Runner, broadcasting/film/video and reports she was last able to speak normally at 5:30 PM, leading to family and prior after the left flank had passed away. Shortly after she had difficulty expressing herself and was unable to write a note in the chart, but denies any new headache, visual symptoms, weakness or sensory symptoms.  She did have her typical headache, she gets headaches about once a month, typically in the setting of fatigue, which is occasionally associated with nausea and she feels may be related to sinus issues.  Her typical headache generally resolves with Tylenol within a few hours.  She notes significant stress in the setting of graduating August 18 and currently house hunting for her home back in [place].  Additionally she was out of her blood pressure medications for about 3 days but was able to restart them 2 days ago.  Risks and benefits were confirmed with the patient as well as her husband given her mild expressive aphasia.  Lack of any contraindications was confirmed with both parties and decision was made to proceed with tPA after both of them verbally consented.  Despite low NIH stroke scale given disabling effects of language difficulties, she was felt to be a good candidate for tPA  LKW: 17:30 PM tPA given?: Yes 19:43  Slight delay to obtain IV access and to administer labetalol (required only 1 dose of 10 mg) to achieve safe blood pressure prior to initiating medication IA performed?: No, no LVO Premorbid modified rankin scale:      0 - No symptoms.  ROS: All other review of systems was negative except as noted in the HPI.   Past Medical History:  Diagnosis Date   Allergy    Anxiety    Arthritis    Depression    GERD (gastroesophageal reflux  disease)    Hypertension    Past Surgical History:  Procedure Laterality Date   ABDOMINAL HYSTERECTOMY  1995   ROTATOR CUFF REPAIR  2015   Current Outpatient Medications  Medication Instructions   celecoxib (CELEBREX) 100 mg, Oral, 2 times daily   citalopram (CELEXA) 20 mg, Oral, Daily   hydrochlorothiazide (HYDRODIURIL) 25 mg, Oral, Daily   loratadine (CLARITIN) 10 MG tablet 1 tablet, Oral, Daily   omeprazole (PRILOSEC) 20 mg, Oral, Daily   pravastatin (PRAVACHOL) 20 mg, Oral, Daily  - Confimed no anticoagulation use with patient and husband  Family History  Problem Relation Age of Onset   Arthritis Mother    Hypertension Mother    Alcohol abuse Father    Cancer Father    Early death Father    Cirrhosis Father    Arthritis Brother    Hyperlipidemia Brother    Hypertension Brother    Stroke Maternal Grandmother    Heart attack Maternal Grandfather    Heart disease Maternal Grandfather    Stroke Paternal Grandmother    Heart attack Paternal Grandfather    Social History:  reports that she has quit smoking. Her smoking use included cigarettes. She has a 7.50 pack-year smoking history. She has never used smokeless tobacco. She reports current alcohol use of about 2.0 standard drinks of alcohol per week. She reports that she does not use drugs.   Exam: Current vital signs: BP (!) 170/101 (BP Location:  Right Arm)   Pulse 83   Resp (!) 22   Wt 93 kg   SpO2 96%   BMI 35.19 kg/m  Vital signs in last 24 hours: Pulse Rate:  [83] 83 (06/28 1911) Resp:  [22] 22 (06/28 1911) BP: (170)/(101) 170/101 (06/28 1911) SpO2:  [96 %] 96 % (06/28 1911) Weight:  [93 kg] 93 kg (06/28 1911)   Physical Exam  Constitutional: Appears well-developed and well-nourished.  Psych: Affect appropriate to situation, mildly anxious/frustrated by her communicative difficulties Eyes: No scleral injection HENT: No oropharyngeal obstruction.  MSK: no joint deformities.  Cardiovascular: Normal rate  and regular rhythm.  Respiratory: Effort normal, non-labored breathing GI: Soft.  No distension. There is no tenderness.  Skin: Warm dry and intact visible skin  Neuro: Mental Status: Patient is awake, alert, oriented to person, place, month, age, and situation. Patient is able to give a clear and coherent history, with some mild language errors No signs of neglect.  Mild difficulty naming with low-frequency objects and some difficulty with paraphasic errors on repetition (heart instead of hot for example) Cranial Nerves: II: Visual Fields are full. Pupils are equal, round, and reactive to light.   III,IV, VI: EOMI without ptosis or diploplia.  V: Facial sensation is symmetric to light touch VII: Facial movement is symmetric.  VIII: hearing is intact to voice X: Uvula elevates symmetrically XI: Shoulder shrug is symmetric. XII: tongue is midline without atrophy or fasciculations.  Motor: Tone is normal. Bulk is normal. 5/5 strength was present in all four extremities.  Sensory: Sensation is symmetric to light touch and temperature in the arms and legs. Deep Tendon Reflexes: 2+ and symmetric in the biceps and patellae.  Cerebellar: FNF and HKS are intact bilaterally Gait:  Steadily moves from wheelchair to scanner bed without leaning to or favoring either side   NIHSS total 1 Score breakdown: Mild aphasia  I have reviewed labs in epic and the results pertinent to this consultation are: Potassium 3.0, mild leukocytosis to 12.2, glucose 84, platelets 244  I have reviewed the images obtained: Head CT negative for acute intracranial process, there is some chronic microvascular disease, ASPECTS 10 CTA negative for acute occlusion  Impression: Stroke versus TIA of Broca's area in this patient with stroke risk factors including obesity, hypertension, age and remote smoking.  Already improving after tPA administration.  Less likely complicated migraine given no prior history of  language difficulties associated with her headaches.  Recommendations: # Possible Broca's area stroke versus TIA, s/p tPA - Stroke labs HgbA1c, fasting lipid panel - MRI brain  - Frequent neuro checks - Echocardiogram - Hold antiplatelet medications until 24 hours after tPA and then initiate if patient remains stable - Risk factor modification, diet, exercise, medication adherence - Telemetry monitoring;  - Blood pressure goal   - Post tPA for 24  hours < 180/105 - Speech consult, no need for PT/OT at this time unless patient declines - Patient will be an OPTIMIST trial candidate only if her blood pressure remains within goal and she remains clinically stable 2 hours post tPA administration (21:43) - NPO pending bedside swallow eval - Admit to stroke team  to follow  #Hypertension -Continue home HCTZ 25 mg daily  #Hyperlipidemia - Home statin is pravastatin 20 mg, increase to high intensity statin (atorvastatin 40 or 80 mg) as needed to meet LDL goal less than 70  #Other chronic home medications -Continue home citalopram 20 mg daily, pantoprazole 40 mg daily and loratadine  10 mg daily  DVT prophylaxis with SCDs GI prophylaxis with pantoprazole, senna as needed   Brooke Dare MD- PhD [facility] [phone]  Total critical care time: 70 minutes   Critical care time was exclusive of separately billable procedures and treating other patients.   Critical care was necessary to treat or prevent imminent or life-threatening deterioration.  Specifically, patient was being evaluated for acute onset neurological change with clinical concern for stroke meeting criteria for tPA administration   Critical care was time spent personally by me on the following activities: development of treatment plan with patient and/or surrogate as well as nursing, discussions with consultants/primary team, evaluation of patient's response to treatment, examination of patient, obtaining  history from patient or surrogate, ordering and performing treatments and interventions, ordering and review of laboratory studies, ordering and review of radiographic studies, and re-evaluation of patient's condition as needed, as documented above.

## 2020-06-30 NOTE — ED Notes (Signed)
Code Stroke Activated  

## 2020-06-30 NOTE — Code Documentation (Signed)
Stroke Response Nurse Documentation Code Documentation  Diane Harris is a 70 y.o. female arriving to [facility] ED on 06/30/2020 with past medical hx of HTN, anxiety and GERD. Code stroke was activated by ED.  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 On No antithrombotic. Stroke team at the bedside on patient arrival. Labs drawn and patient cleared for CT by Dr. Madilyn Hook. Patient to CT with team. NIHSS 1, see documentation for details and code stroke times. Patient with Expressive aphasia  on exam. The following imaging was completed: CT, CTA head and neck, CTP. BP 192/83  HR 77, 10mg  Labetalol given IV.  2 PIVs placed. Patient is a candidate for tPA.  tPA started at 1943.  Care/Plan. Bedside handoff with ED RN .  Modified NIHSS and VS q15 min x 2 hours then q32min x 6hrs  31m  Stroke Response RN

## 2020-06-30 NOTE — ED Provider Notes (Signed)
[facility] EMERGENCY DEPARTMENT Provider Note   CSN: [id number] Arrival date & time: 06/30/20  1907     History No chief complaint on file.   Diane Harris is a 70 y.o. female.  The history is provided by the patient.  Diane Harris is a 71 y.o. female who presents to the Emergency Department complaining of speech difficulties. She presents the emergency department for evaluation of speech difficulties. Last known well at 5 PM today. 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 She denies any additional symptoms. She has a history of hypertension.    Past Medical History:  Diagnosis Date   Allergy    Anxiety    Arthritis    Depression    GERD (gastroesophageal reflux disease)    Hypertension     Patient Active Problem List   Diagnosis Date Noted   Essential hypertension 04/16/2019   Mixed hyperlipidemia 10/02/2018   Axillary lymphadenopathy 09/28/2017   Vitamin D deficiency 09/28/2017   Age-related cataract of both eyes 08/02/2017   Anxiety 08/02/2017   Chronic pain of both knees 08/02/2017   Gastroesophageal reflux disease without esophagitis 08/02/2017    Past Surgical History:  Procedure Laterality Date   ABDOMINAL HYSTERECTOMY  1995   ROTATOR CUFF REPAIR  2015     OB History   No obstetric history on file.     Family History  Problem Relation Age of Onset   Arthritis Mother    Hypertension Mother    Alcohol abuse Father    Cancer Father    Early death Father    Cirrhosis Father    Arthritis Brother    Hyperlipidemia Brother    Hypertension Brother    Stroke Maternal Grandmother    Heart attack Maternal Grandfather    Heart disease Maternal Grandfather    Stroke Paternal Grandmother    Heart attack Paternal Grandfather     Social History   Tobacco Use   Smoking status: Former    Packs/day: 0.50    Years: 15.00    Pack years: 7.50    Types: Cigarettes   Smokeless tobacco: Never  Vaping Use   Vaping  Use: Never used  Substance Use Topics   Alcohol use: Yes    Alcohol/week: 2.0 standard drinks    Types: 2 Glasses of wine per week   Drug use: Never    Home Medications Prior to Admission medications   Medication Sig Start Date End Date Taking? Authorizing Provider  celecoxib (CELEBREX) 100 MG capsule Take 1 capsule (100 mg total) by mouth 2 (two) times daily. 06/03/20   Eulis Foster, FNP  citalopram (CELEXA) 20 MG tablet Take 1 tablet (20 mg total) by mouth daily. 06/11/20   Eulis Foster, FNP  hydrochlorothiazide (HYDRODIURIL) 25 MG tablet Take 1 tablet (25 mg total) by mouth daily. 12/02/19   Waldon Merl, PA-C  loratadine (CLARITIN) 10 MG tablet Take 1 tablet by mouth daily. 11/18/19   [provider]  omeprazole (PRILOSEC) 20 MG capsule Take 1 capsule (20 mg total) by mouth daily. 06/11/20   Eulis Foster, FNP  pravastatin (PRAVACHOL) 20 MG tablet Take 1 tablet (20 mg total) by mouth daily. 12/02/19   Waldon Merl, PA-C    Allergies    Patient has no known allergies.  Review of Systems   Review of Systems  All other systems reviewed and are negative.  Physical Exam Updated Vital Signs There were no vitals  taken for this visit.  Physical Exam Vitals and nursing note reviewed.  Constitutional:      Appearance: She is well-developed.  HENT:     Head: Normocephalic and atraumatic.  Cardiovascular:     Rate and Rhythm: Normal rate and regular rhythm.     Heart sounds: No murmur heard. Pulmonary:     Effort: Pulmonary effort is normal. No respiratory distress.     Breath sounds: Normal breath sounds.  Abdominal:     Palpations: Abdomen is soft.     Tenderness: There is no abdominal tenderness. There is no guarding or rebound.  Musculoskeletal:        General: No tenderness.  Skin:    General: Skin is warm and dry.  Neurological:     Mental Status: She is alert and oriented to person, place, and time.     Comments: No asymmetry of facial movements.  Visual fields grossly intact. Mild to moderate expressive aphasia. Five out of five strength in all four extremities with sensation to light touch intact in all four extremities  Psychiatric:     Comments: Anxious appearing    ED Results / Procedures / Treatments   Labs (all labs ordered are listed, but only abnormal results are displayed) Labs Reviewed  RESP PANEL BY RT-PCR (FLU A&B, COVID) ARPGX2  ETHANOL  PROTIME-INR  APTT  CBC  DIFFERENTIAL  COMPREHENSIVE METABOLIC PANEL  RAPID URINE DRUG SCREEN, HOSP PERFORMED  URINALYSIS, ROUTINE W REFLEX MICROSCOPIC  I-STAT CHEM 8, ED    EKG None  Radiology No results found.  Procedures Procedures  CRITICAL CARE Performed by: Tilden Fossa   Total critical care time: 30 minutes  Critical care time was exclusive of separately billable procedures and treating other patients.  Critical care was necessary to treat or prevent imminent or life-threatening deterioration.  Critical care was time spent personally by me on the following activities: development of treatment plan with patient and/or surrogate as well as nursing, discussions with consultants, evaluation of patient's response to treatment, examination of patient, obtaining history from patient or surrogate, ordering and performing treatments and interventions, ordering and review of laboratory studies, ordering and review of radiographic studies, pulse oximetry and re-evaluation of patient's condition.   Medications Ordered in ED Medications - No data to display  ED Course  I have reviewed the triage vital signs and the nursing notes.  Pertinent labs & imaging results that were available during my care of the patient were reviewed by me and considered in my medical decision making (see chart for details).    MDM Rules/Calculators/A&P                         patient here for evaluation of speech difficulties. Patient is a phasic on ED presentation. A code stroke was  activated. She was evaluated by neurology team with plan to admit for ongoing treatment. She was given TPA by neurology.  later during ED stay after TPA patient did complain of nausea, headache. She was treated with Zofran with improvement nausea. Plan to check repeat CT head.  Final Clinical Impression(s) / ED Diagnoses Final diagnoses:  None    Rx / DC Orders ED Discharge Orders     None        Tilden Fossa, MD 07/01/20 0002

## 2020-06-30 NOTE — Progress Notes (Signed)
   06/30/20 1850  Clinical Encounter Type  Visited With Patient and family together  Visit Type Initial;ED;Spiritual support;Social support  Referral From Patient  Consult/Referral To Chaplain  Spiritual Encounters  Spiritual Needs Prayer;Emotional   Chaplains [place] and [place] responded to call from Pt. Pt's husband, Greggory Stallion, is at bedside. Chaplains provided emotional and spiritual support. Chaplain Vincella prayed with Pt and Pt's husband. The on-call chaplain, Toni Amend, will check in as needed.   This note was prepared by Chaplain Resident, Tacy Learn, MDiv. Chaplain remains available as needed through the on-call pager: [phone].

## 2020-07-01 ENCOUNTER — Inpatient Hospital Stay (HOSPITAL_COMMUNITY): Payer: Medicare PPO

## 2020-07-01 DIAGNOSIS — E7800 Pure hypercholesterolemia, unspecified: Secondary | ICD-10-CM

## 2020-07-01 DIAGNOSIS — R479 Unspecified speech disturbances: Secondary | ICD-10-CM

## 2020-07-01 DIAGNOSIS — I350 Nonrheumatic aortic (valve) stenosis: Secondary | ICD-10-CM

## 2020-07-01 DIAGNOSIS — G43109 Migraine with aura, not intractable, without status migrainosus: Principal | ICD-10-CM

## 2020-07-01 DIAGNOSIS — I10 Essential (primary) hypertension: Secondary | ICD-10-CM

## 2020-07-01 LAB — ECHOCARDIOGRAM COMPLETE
AR max vel: 1.75 cm2
AV Area VTI: 1.87 cm2
AV Area mean vel: 1.72 cm2
AV Mean grad: 12 mmHg
AV Peak grad: 22.5 mmHg
Ao pk vel: 2.37 m/s
Area-P 1/2: 3.42 cm2
S' Lateral: 2.7 cm
Weight: 3280.44 oz

## 2020-07-01 LAB — LIPID PANEL
Cholesterol: 200 mg/dL (ref 0–200)
HDL: 55 mg/dL (ref >40)
LDL Cholesterol: 125 mg/dL — ABNORMAL HIGH (ref 0–99)
Total CHOL/HDL Ratio: 3.6 RATIO
Triglycerides: 102 mg/dL (ref <150)
VLDL: 20 mg/dL (ref 0–40)

## 2020-07-01 LAB — MRSA NEXT GEN BY PCR, NASAL: MRSA by PCR Next Gen: NOT DETECTED

## 2020-07-01 MED ORDER — PRAVASTATIN SODIUM 40 MG PO TABS: 40.0000 mg | ORAL_TABLET | Freq: Every day | ORAL | Status: DC

## 2020-07-01 MED ORDER — ASPIRIN 81 MG PO CHEW
81.0000 mg | CHEWABLE_TABLET | Freq: Every day | ORAL | Status: DC
  Administered 2020-07-01: 81 mg via ORAL
  Filled 2020-07-01: qty 1

## 2020-07-01 MED ORDER — POTASSIUM CHLORIDE 20 MEQ PO PACK
40.0000 meq | PACK | Freq: Once | ORAL | Status: AC
  Administered 2020-07-01: 40 meq via ORAL
  Filled 2020-07-01: qty 2

## 2020-07-01 MED ORDER — PANTOPRAZOLE SODIUM 40 MG PO TBEC
40.0000 mg | DELAYED_RELEASE_TABLET | Freq: Every day | ORAL | Status: DC
  Administered 2020-07-01: 40 mg via ORAL

## 2020-07-01 NOTE — Progress Notes (Signed)
  Echocardiogram 2D Echocardiogram has been performed.  Augustine Radar 07/01/2020, 11:49 AM

## 2020-07-01 NOTE — Progress Notes (Addendum)
STROKE TEAM PROGRESS NOTE    Interval History   No acute events overnight, Diane Harris had a headache this morning but states that it has improved now that she got up and moved to the chair.   Her husband is at bedside.   She was informed that fortunately she did not have a stroke. She recounts that she does have a history of migraine with her last one being 5 years ago. Explained to her that her presentation could have been due to a complicated migraine given lack of stroke on MRI.   Pertinent Lab Work and Imaging    06/30/20 CT Head WO IV Contrast No evidence of acute intracranial abnormality. ASPECTS is 10.   Complete opacification of the left maxillary sinus. This may reflect odontogenic sinusitis as there is periapical lucency surrounding the left maxillary second molar.   Nonspecific polypoid soft tissue extending into the left nasal passage. Consider elective ENT consultation and direct visualization.  CT Angio Head and Neck W WO IV Contrast CTA neck: The common carotid, internal carotid and vertebral arteries are patent within the neck without hemodynamically significant stenosis (50% or greater). Atherosclerotic disease, as described.   CTA head: No intracranial large vessel occlusion or proximal high-grade arterial stenosis.  06/30/20 MRI Brain WO IV Contrast 1. No acute intracranial abnormality. 2. Left maxillary sinus opacification.  07/01/20 Echocardiogram Complete  Pending   Physical Examination   Constitutional: Calm, appropriate for condition  Cardiovascular: Normal RR Respiratory: No increased WOB   Mental status: AAOx4, following commands  Speech: Fluent, repetition and naming intact  Cranial nerves: EOMI, VFF, Face symmetric, Tongue midline  Motor: Normal bulk and tone. No drift. Full strength throughout  Sensory: Sensation intact to light touch  Coordination: Intact FNF  Gait: Deferred    Assessment and Plan   Diane Harris is a 70 y.o. female  w/pmh of anxiety, depression, hypertension who presents with difficulty speaking. She received IVTPA and did not qualify for thrombectomy due to lack of LVO.   NEURO #Difficulty speaking ( resolved) #Complex Migraine  Diane Harris presented with the symptoms described above. At this time, her stroke work up is complete with echo results pending. CTA Head and Neck did not show significant stenosis. Her MRI Brain was negative for an acute stroke. Stroke labs w/LDL 125 and hemoglobin A1C pending. Differential diagnoses include TIA versus complex migraine. Stoke risk factors include hyperlipidemia and hypertension. Will initiate Aspirin given stroke risk factors and due to her cardiac risk profile.  -Continue Aspirin 81 mg  QD and Pravastatin 40 mg QD for stroke prevention ( start at 1800 given she received IVTPA 6/28 at 19:43)  -At discharge will place ambulatory referral to neurology for stroke follow up   CARDS #Hypertension She has a history of HTN and takes HCTZ 25 mg QD at home. Currently blood pressure is trending normotensive with fluctuations to the 140 range. Given no acute stroke will resume her HCTZ at half dose and continue full dose at discharge.  - Start HCTZ 12.5 QD - Long term blood pressure goal < 130/80   #Hyperlipidemia From a stroke prevention stand point, the LDL goal is < 70. LDL is 125 this home Pravastatin 20 was increased to 40 this admission.   RESP Oxygenating well on RA. No acute respiratory issues at this time  RENAL  GI #Stoke dysphagia screening Passed bedside swallow eval for a regular diet   ENDO #Stroke Diabetes Screening  Hemoglobin A1C this admission pending  HEME Hemoglobin hematocrit and platelet count stable   ID Afebrile. No infectious processes at this time. WBC mildly elevated will trend.    Hospital day # 1  Stark Jock, NP  [facility] Nurse Practitioner Diane Harris seen and discussed with attending physician Dr. Roda Shutters    ATTENDING NOTE: I reviewed above note and agree with the assessment and plan. Pt was seen and examined.   70 year old female with history of hypertension, anxiety, headache admitted for episode of aphasia, difficulty writing.  CT no acute abnormality, status post tPA.  Overnight Diane Harris has severe headache, resolved this morning.  Diane Harris endorsed that she had headache in the past, last headache about 3-5 years ago.  She is not sure if it was migraine or sinus headache.  MRI no acute stroke.  CTA head and neck negative, CT PE negative.  EF 65 to 70%.  LDL 125, A1c pending.  UDS negative.  Creatinine 0.80, WBC 12.2.  CT head at 24 hours of tPA pending.  On exam, Diane Harris husband at bedside, Diane Harris neurologically intact, sitting in chair.  No focal deficits.  Etiology for Diane Harris's symptoms concerning for complicated migraine, pending MRI to rule out stroke.  Increase pravastatin 20-40 given elevated LDL.  Calculated Diane Harris ASCVD risk which is moderate, recommend aspirin 81 if CT repeat negative for bleeding, and continue on discharge.  For detailed assessment and plan, please refer to above as I have made changes wherever appropriate.   Marvel Plan, MD PhD Stroke Neurology 07/01/2020 6:56 PM  This Diane Harris is critically ill due to strokelike symptoms status post tPA and at significant risk of neurological worsening, death form bleeding from tPA. This Diane Harris's care requires constant monitoring of vital signs, hemodynamics, respiratory and cardiac monitoring, review of multiple databases, neurological assessment, discussion with family, other specialists and medical decision making of high complexity. I spent 30 minutes of neurocritical care time in the care of this Diane Harris. I had long discussion with husband and Diane Harris at bedside, updated pt current condition, treatment plan and potential prognosis, and answered all the questions.  They expressed understanding and appreciation.     To contact  Stroke Continuity provider, please refer to [email or web address]. After hours, contact General Neurology

## 2020-07-01 NOTE — Evaluation (Addendum)
SLP Note SCREEN  Patient Details Name: Miryam Mcelhinney Rappa MRN: [id number] DOB: November 15, 1950   Screen:       Reason Eval/Treat Not Completed: SLP screened, no needs identified, will sign off (pt with negative brain imaging, admits to complete resolution of all symptoms and spouse confirms, no SLP needs identified)  Rolena Infante, MS [facility] SLP Acute Rehab Services Office [phone] Pager [phone]   Chales Abrahams 07/01/2020, 11:05 AM

## 2020-07-01 NOTE — Research (Signed)
Patient is eligible for OPTIMISTmain trial with receiving tPA and having no other critical care needs. Patient verbalized understanding of data collection, discharge questions, and 90 day questions.   Information Sheet given to patient and left at bedside. All questions answered.   Donny Pique RN BSN SCRN Stroke Response Coordinator

## 2020-07-02 DIAGNOSIS — Z9282 Status post administration of tPA (rtPA) in a different facility within the last 24 hours prior to admission to current facility: Secondary | ICD-10-CM

## 2020-07-02 LAB — HEMOGLOBIN A1C
Hgb A1c MFr Bld: 5.3 % (ref 4.8–5.6)
Mean Plasma Glucose: 105 mg/dL

## 2020-07-02 MED ORDER — PRAVASTATIN SODIUM 40 MG PO TABS: 40.0000 mg | ORAL_TABLET | Freq: Every day | ORAL | 3 refills | Status: AC

## 2020-07-02 MED ORDER — ASPIRIN 81 MG PO CHEW: 81.0000 mg | CHEWABLE_TABLET | Freq: Every day | ORAL | 3 refills | Status: AC

## 2020-07-02 NOTE — Discharge Summary (Addendum)
Stroke Discharge Summary  Patient ID: Diane Harris       MRN: [id number]      DOB: September 24, 1950  Date of Admission: 06/30/2020 Date of Discharge: 07/02/2020  Attending Physician: Dr. Roda Shutters  Consultant(s):  None  Patient's PCP:  Pcp, No  DISCHARGE DIAGNOSIS:  Principal Problem:   Difficulty speaking   Allergies as of 07/02/2020   No Known Allergies      Medication List     TAKE these medications    aspirin 81 MG chewable tablet Chew 1 tablet (81 mg total) by mouth daily.   CENTRUM PO Take 1 tablet by mouth at bedtime.   ibuprofen 200 MG tablet Commonly known as: ADVIL Take 400 mg by mouth every 6 (six) hours as needed for mild pain or headache.   loratadine 10 MG tablet Commonly known as: CLARITIN Take 1 tablet by mouth at bedtime as needed for allergies.   PATADAY OP Place 1 drop into both eyes daily as needed (dry eyes).   pravastatin 40 MG tablet Commonly known as: PRAVACHOL Take 1 tablet (40 mg total) by mouth daily. What changed:  medication strength how much to take       ASK your doctor about these medications    celecoxib 100 MG capsule Commonly known as: CELEBREX Take 1 capsule (100 mg total) by mouth 2 (two) times daily.   citalopram 20 MG tablet Commonly known as: CELEXA Take 1 tablet (20 mg total) by mouth daily.   hydrochlorothiazide 25 MG tablet Commonly known as: HYDRODIURIL Take 1 tablet (25 mg total) by mouth daily.   omeprazole 20 MG capsule Commonly known as: PRILOSEC Take 1 capsule (20 mg total) by mouth daily.         History of Present Illness  Diane Harris is a 70 y.o. woman with a PMHx significant for hypertension, anxiety/depression.   She works here as a Runner, broadcasting/film/video and reports she was last able to speak normally at 5:30 PM, leading to family and prior after the left flank had passed away. Shortly after she had difficulty expressing herself and was unable to write a note in the chart, but denies any new headache,  visual symptoms, weakness or sensory symptoms.  She did have her typical headache, she gets headaches about once a month, typically in the setting of fatigue, which is occasionally associated with nausea and she feels may be related to sinus issues.  Her typical headache generally resolves with Tylenol within a few hours.  She notes significant stress in the setting of graduating August 18 and currently house hunting for her home back in [place].  Additionally she was out of her blood pressure medications for about 3 days but was able to restart them 2 days ago.   Risks and benefits were confirmed with the patient as well as her husband given her mild expressive aphasia.  Lack of any contraindications was confirmed with both parties and decision was made to proceed with tPA after both of them verbally consented.  Despite low NIH stroke scale given disabling effects of language difficulties, she was felt to be a good candidate for tPA   LKW: 17:30 PM tPA given?: Yes 19:43             Slight delay to obtain IV access and to administer labetalol (required only 1 dose of 10 mg) to achieve safe blood pressure prior to initiating medication IA performed?: No, no LVO Premorbid modified rankin scale:  0 - No symptoms.  Pertinent Lab Work and Imaging  06/30/20 CT Head WO IV Contrast No evidence of acute intracranial abnormality. ASPECTS is 10.   Complete opacification of the left maxillary sinus. This may reflect odontogenic sinusitis as there is periapical lucency surrounding the left maxillary second molar.   Nonspecific polypoid soft tissue extending into the left nasal passage. Consider elective ENT consultation and direct visualization.   CT Angio Head and Neck W WO IV Contrast CTA neck: The common carotid, internal carotid and vertebral arteries are patent within the neck without hemodynamically significant stenosis (50% or greater). Atherosclerotic disease, as described.    CTA head: No intracranial large vessel occlusion or proximal high-grade arterial stenosis.   06/30/20 MRI Brain WO IV Contrast 1. No acute intracranial abnormality. 2. Left maxillary sinus opacification.  07/01/20 Echocardiogram   1. Left ventricular ejection fraction, by estimation, is 65 to 70%. The left ventricle has normal function. The left ventricle has no regional wall motion abnormalities. There is mild left ventricular hypertrophy. LV diastolic function is likely grossly normal.   2. Right ventricular systolic function is normal. The right ventricular size is normal. There is normal pulmonary artery systolic pressure. The estimated right ventricular systolic pressure is 26.6 mmHg.   3. The mitral valve is normal in structure. Trivial mitral valve regurgitation. No evidence of mitral stenosis.   4. The aortic valve is grossly normal. There is mild calcification of the aortic valve. Aortic valve regurgitation is trivial. Mild aortic valve stenosis. Aortic valve mean gradient measures 12.0 mmHg.   5. The inferior vena cava is normal in size with greater than 50% respiratory variability, suggesting right atrial pressure of 3 mmHg.   6. Increased flow velocities may be secondary to anemia, thyrotoxicosis, hyperdynamic or high flow state.   Physical Examination Constitutional: Calm, appropriate for condition Cardiovascular: Normal RR Respiratory: No increased WOB   Mental status: AAOx4, following commands  Speech: Fluent, repetition and naming intact  Cranial nerves: EOMI, VFF, Face symmetric, Tongue midline  Motor: Normal bulk and tone. No drift. Full strength throughout  Sensory: Sensation intact to light touch  Coordination: Intact FNF  Gait: Deferred   Assessment and Plan Diane Harris is a 70 y.o. female w/pmh of anxiety, depression, hypertension who presents with difficulty speaking. She received IVTPA and did not qualify for thrombectomy due to lack of LVO.   #Difficulty  speaking ( resolved) #Complex Migraine  #TIA  Patient presented with the symptoms described above. At this time, her stroke work up is complete. CTA Head and Neck did not show significant stenosis. Her MRI Brain was negative for an acute stroke. Echocardiogram w/EF 65 to 70 % and LA normal in size. Stroke labs w/LDL 125 and hemoglobin A1C 5.3 Differential diagnoses include TIA versus seizure versus complex migraine. Stoke risk factors include hyperlipidemia and hypertension. This admission Aspirin 81 mg was initiated given stroke risk factors (HTN, HLD) and due to her cardiac risk profile. Her home Pravastatin was increased from 20 to 40 this admission for better control of her hyperlipidemia.   #Hypertension She has a history of HTN and takes HCTZ 25 mg QD at home. Currently blood pressure is trending normotensive with fluctuations to the 140 range. Given no acute stroke her home HCTZ 25 mg QD was resumed this admission and continued at discharge.  #Hyperlipidemia From a stroke prevention stand point, the LDL goal is < 70. LDL is 125 this home Pravastatin 20 was increased to 40  this admission. Pravastatin 40 mg is to be continued at discharge.   #Stoke dysphagia screening Passed bedside swallow eval for a regular diet   #Stroke Diabetes Screening  Hemoglobin A1C 5.3, at goal from a stroke prevention standpoint.   Hospital day # 2  Diane Jock, NP  [facility] Nurse Practitioner Patient seen and discussed with attending physician Dr. Roda Shutters   35 minutes were spend doing this discharge   ATTENDING NOTE: I reviewed above note and agree with the assessment and plan. Pt was seen and examined.   Husband at bedside, patient sitting in chair, speech fluent, no focal deficit, CT repeat overnight no acute changes, no bleeding.  Patient symptoms still most likely due to complicated migraine, although anxiety, TIA and seizure in differential but less likely.  Increased pravastatin to 40 mg  daily, recommend aspirin 81 daily on discharge.  Patient ready for discharge.  She will follow-up with PCP in the neurology in [place], [place].  Outpatient EEG is reasonable during follow-up.  For detailed assessment and plan, please refer to above as I have made changes wherever appropriate.   Marvel Plan, MD PhD Stroke Neurology 07/02/2020 9:30 AM    To contact Stroke Continuity provider, please refer to [email or web address]. After hours, contact General Neurology

## 2020-07-02 NOTE — Plan of Care (Signed)
Pt d/c to home by car. Assessment stable. D/C instructions given. All questions ansered.

## 2020-07-02 NOTE — Discharge Instructions (Signed)
Diane Harris,  You were hospitalized due to stroke like symptoms. Fortunately you did not have a stroke. Your symptoms possibly could have been due to a complex migraine.   You have stroke risk factors thus we recommend that you start to take a baby aspirin daily for stroke prevention purposes.   Please follow up with your PCP at your leisure.  It was a pleasure caring for you. Your medical team wishes you the best.   Sincerely,   [facility]roke [facility]

## 2020-07-07 ENCOUNTER — Other Ambulatory Visit: Payer: Self-pay | Admitting: Family

## 2020-07-07 DIAGNOSIS — F419 Anxiety disorder, unspecified: Secondary | ICD-10-CM

## 2020-09-23 ENCOUNTER — Encounter: Payer: Self-pay | Admitting: *Deleted

## 2020-09-28 ENCOUNTER — Encounter: Payer: Self-pay | Admitting: *Deleted

## 2020-10-05 ENCOUNTER — Other Ambulatory Visit: Payer: Self-pay

## 2020-10-05 ENCOUNTER — Encounter: Payer: Self-pay | Admitting: *Deleted

## 2020-10-05 NOTE — Patient Outreach (Signed)
[facility]) Care Management  10/05/2020  Diane Harris 07/20/1950 [id number]   Telephone outreach by Darcel Bayley to patient to obtain mRS was successfully completed. MRS= 0   [facility] Care Management Assistant

## 2020-10-05 NOTE — Progress Notes (Unsigned)
[facility] - 10/05/20 1500       Assessment    Assessment type Phone to patient    Is patient still in hospital? No    Date of hospital discharge after thrombolysis? 07/02/20      Final 90-Day Modified Rankin Score   Final 90-Day Modified Rankin Score: ([employer] [facility]) 0-No symptoms from stroke remain, but able to carry out all usual activities      [id number]   Mobility 1- no problems in walking about    Self-care 1- no problems with Self-care    Usual activities 1- no problems with performing usual activities (e.g. work, study, housework, family or leisure activities)    Pain/discomfort 1- no pain or discomfort    Anxiety/Depression 1- not anxious or depressed    What number between 0-100 best describes the patient's health state today (100 means the best health; 0 means the worst health)? 74      Hospital Admission   In the Past 3 months (since your initial hospitalisation for stroke), have you been admitted to hospital (including day-only procedures) for any reason? No      Doctor consultations   In the past 3 months (since your initial hospitalisation for stroke), have you seen any doctors or other health professional (for example physiotherapy, outpatient nurse, general practitioner) for any reason? Yes      a. Doctor consultations   a. Type of service Urgent Care    a. Condition or purpose Covid Testing    a. Date of appointment 07/23/20      b. Doctor consultations   b. Type of service PCP    b. Condition or purpose Follow up    b. Date of appointment 09/08/20

## 2021-05-08 IMAGING — US US EXTREM LOW VENOUS*L*
1 series · 13 of 24 positions shown · non-contrast
Comparison: None.

CLINICAL DATA: Lower extremity pain and edema

EXAM:
LEFT LOWER EXTREMITY VENOUS DUPLEX ULTRASOUND
TECHNIQUE: Gray-scale sonography with graded compression, as well as color
Doppler and duplex ultrasound were performed to evaluate the left
lower extremity deep venous system from the level of the common
femoral vein and including the common femoral, femoral, profunda
femoral, popliteal and calf veins including the posterior tibial,
peroneal and gastrocnemius veins when visible. The superficial great
saphenous vein was also interrogated. Spectral Doppler was utilized
to evaluate flow at rest and with distal augmentation maneuvers in
the common femoral, femoral and popliteal veins.

[Series 1: us extrem low venous*left* · 0.08mm/px · 13 of 41 slices shown]
[im 1/41]
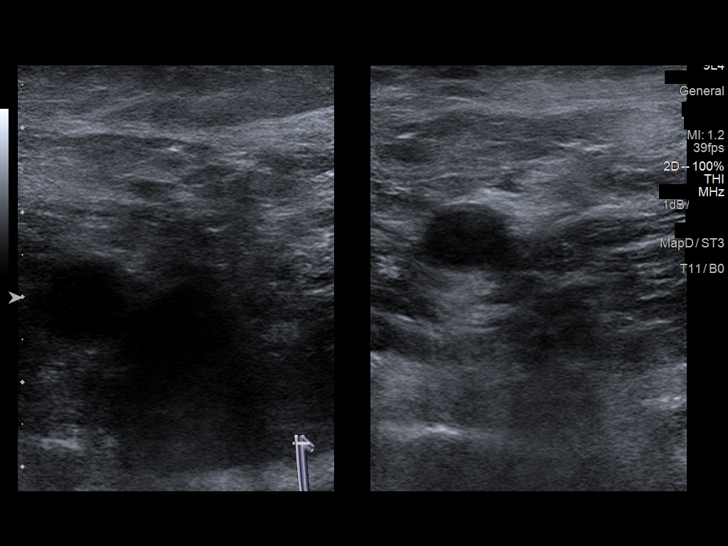
[im 4/41]
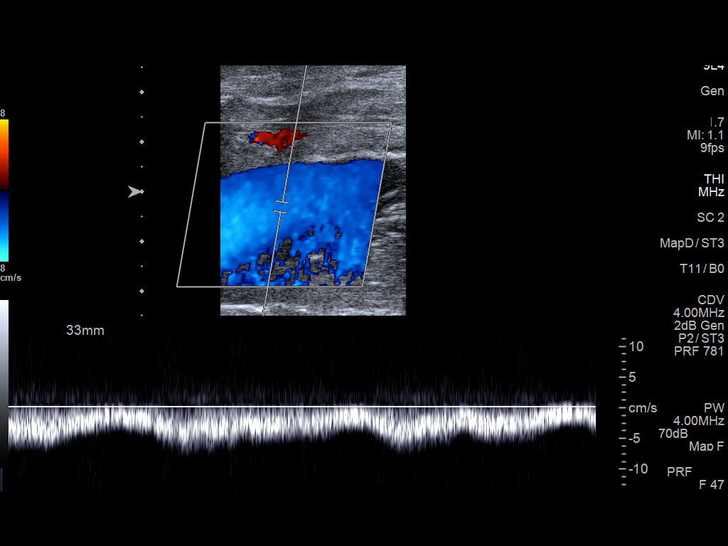
[im 7/41]
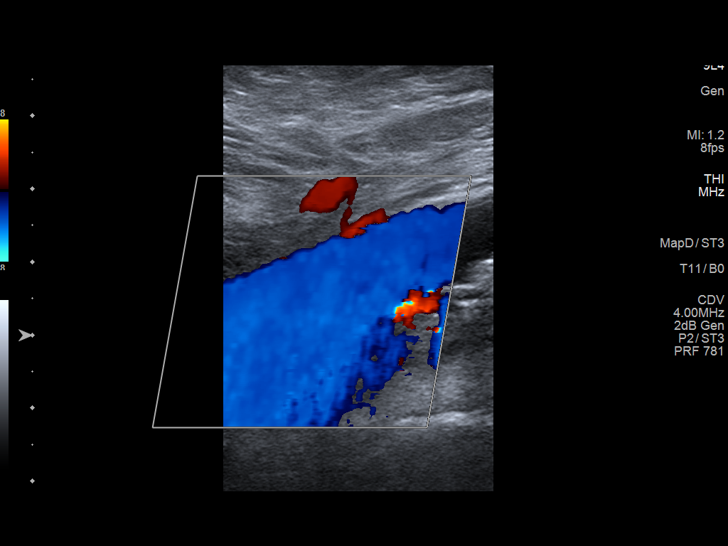
[im 11/41]
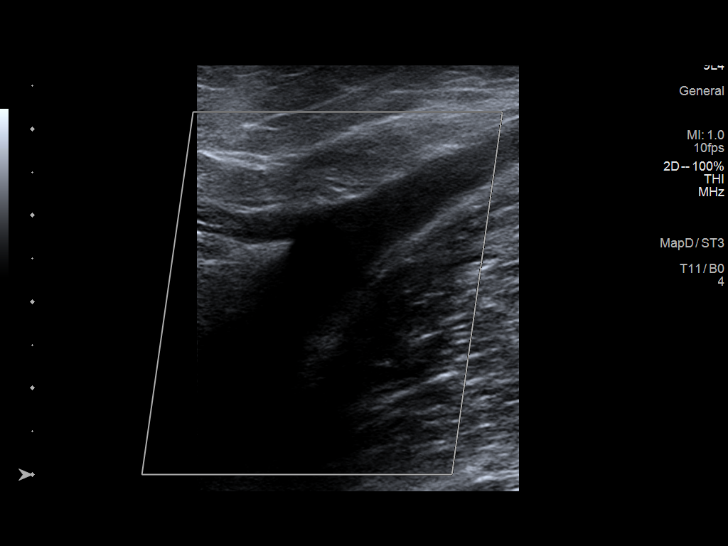
[im 14/41]
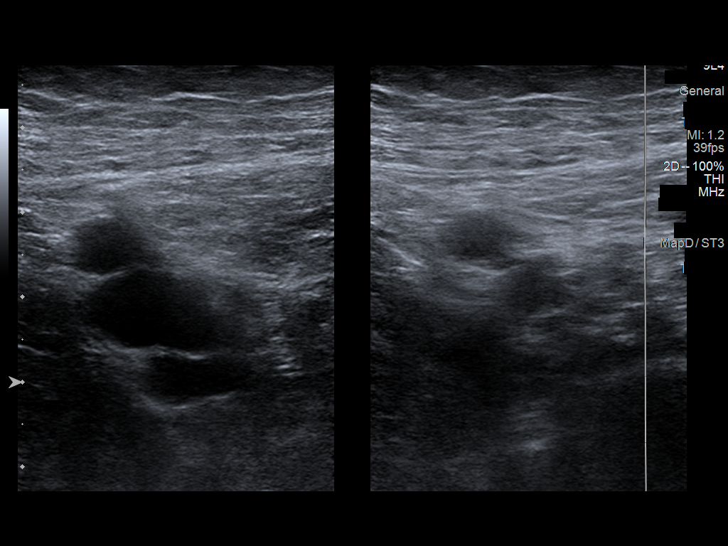
[im 18/41]
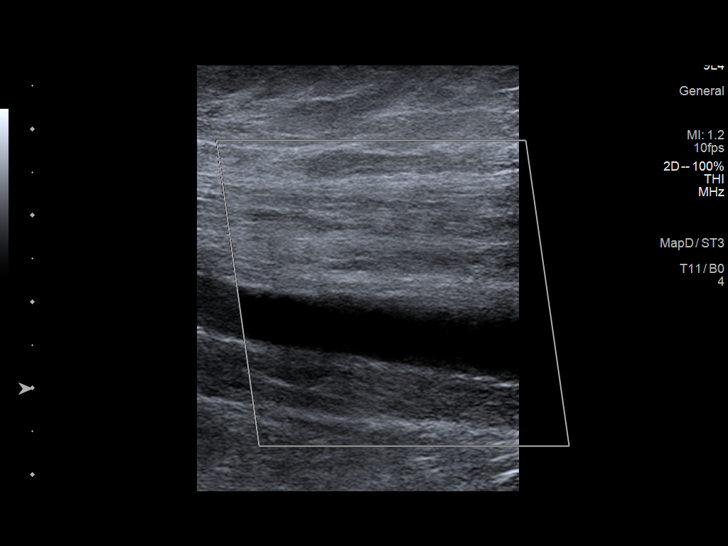
[im 21/41]
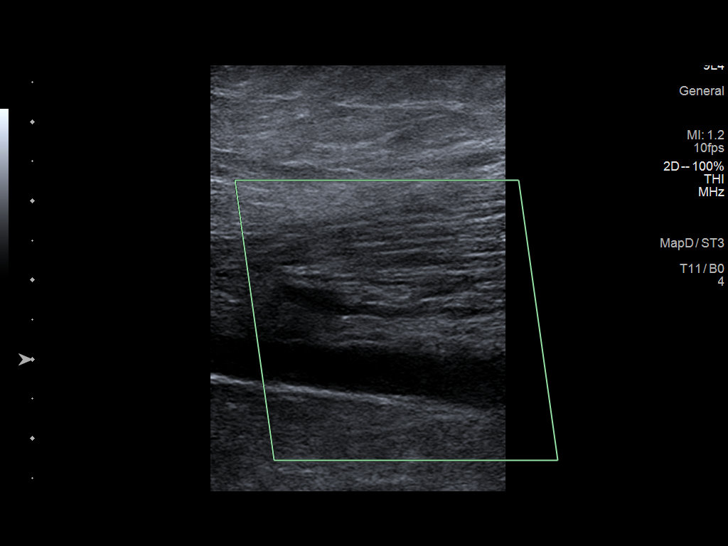
[im 23/41]
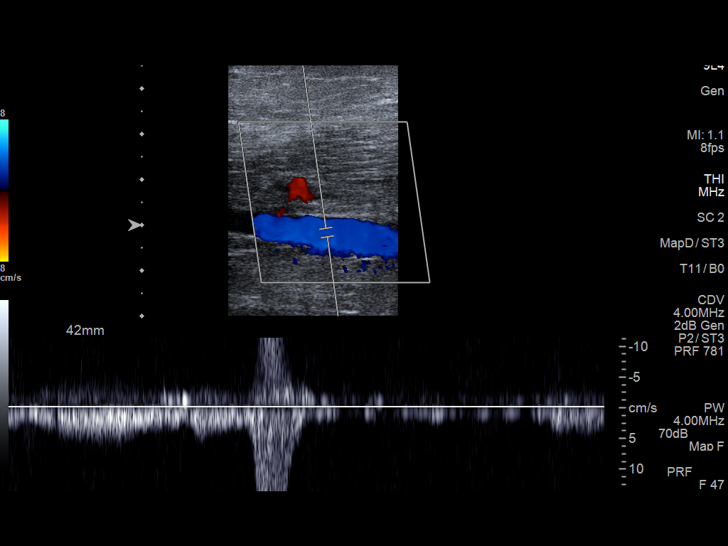
[im 27/41]
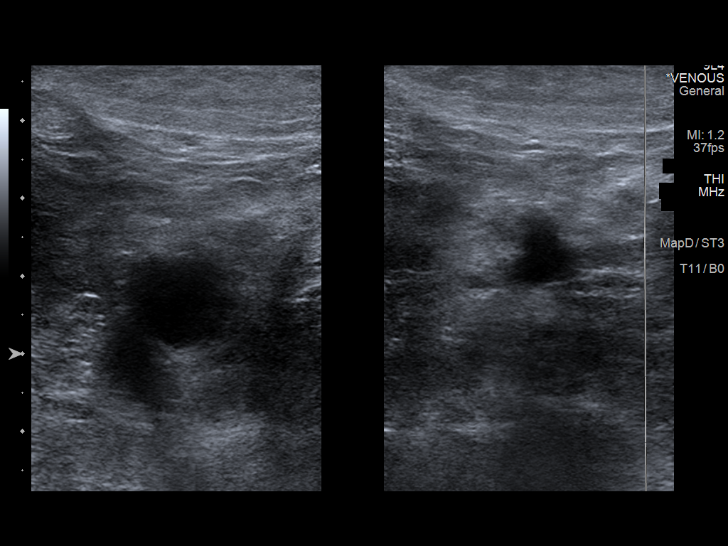
[im 30/41]
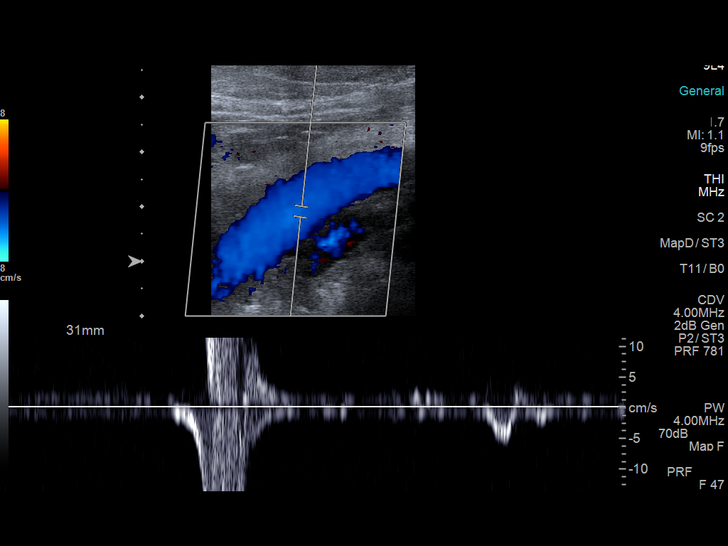
[im 34/41]
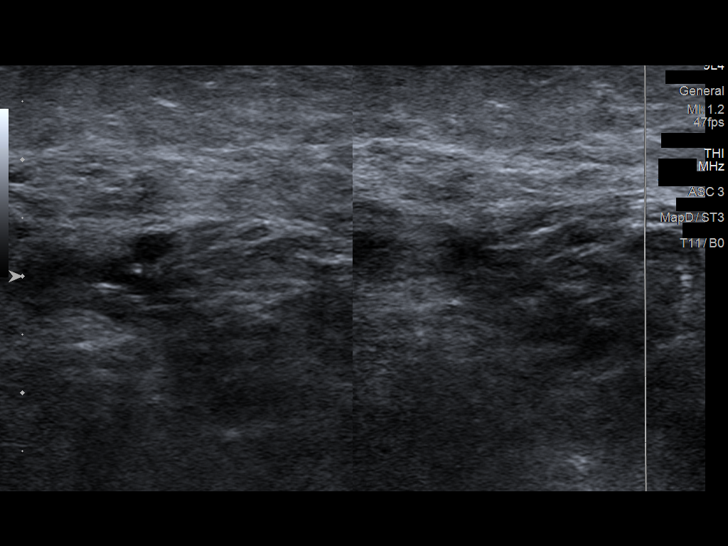
[im 37/41]
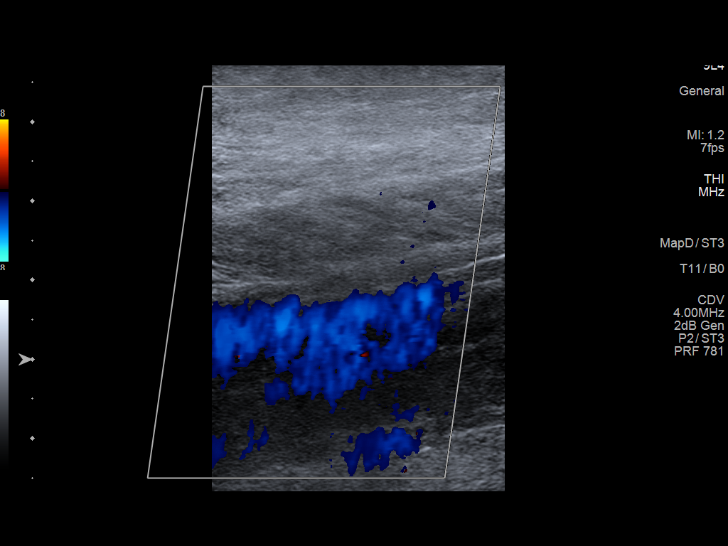
[im 41/41]
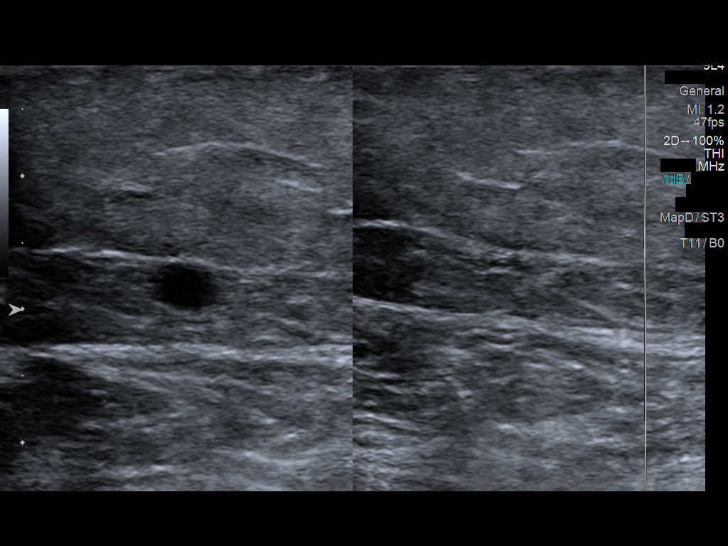

[13 of 24 positions shown; findings below may reference images not displayed]

FINDINGS: Contralateral Common Femoral Vein: Respiratory phasicity is normal
and symmetric with the symptomatic side. No evidence of thrombus.
Normal compressibility.

Common Femoral Vein: No evidence of thrombus. Normal
compressibility, respiratory phasicity and response to augmentation.

Saphenofemoral Junction: No evidence of thrombus. Normal
compressibility and flow on color Doppler imaging.

Profunda Femoral Vein: No evidence of thrombus. Normal
compressibility and flow on color Doppler imaging.

Femoral Vein: No evidence of thrombus. Normal compressibility,
respiratory phasicity and response to augmentation.

Popliteal Vein: No evidence of thrombus. Normal compressibility,
respiratory phasicity and response to augmentation.

Calf Veins: No evidence of thrombus. Normal compressibility and flow
on color Doppler imaging.

Superficial Great Saphenous Vein: No evidence of thrombus. Normal
compressibility.

Venous Reflux:  None.

Other Findings:  None.
IMPRESSION: No evidence of deep venous thrombosis in the left lower extremity.
Right common femoral vein also patent.

## 2021-10-14 IMAGING — CT CT HEAD W/O CM
4 series · 16 of 47 positions shown, 18 images · non-contrast
Comparison: Head CT and MRI 06/30/2020

CLINICAL DATA: Headache.

EXAM:
CT HEAD WITHOUT CONTRAST
TECHNIQUE: Contiguous axial images were obtained from the base of the skull
through the vertex without intravenous contrast.

[Series 3: head without · axial · non-contrast · 0.46mm/px · z∈[-161,-41]mm · 7 of 34 slices shown, 9 images]
[im 5/34  brain]
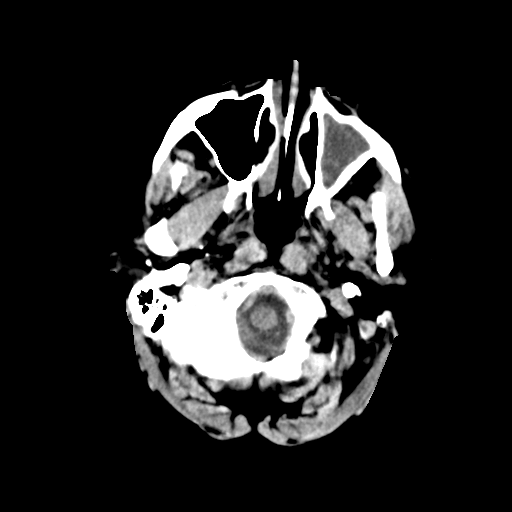
[im 5/34  bone]
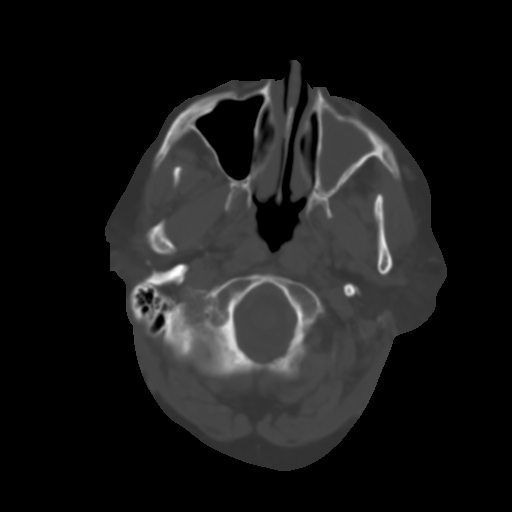
[im 9/34  brain]
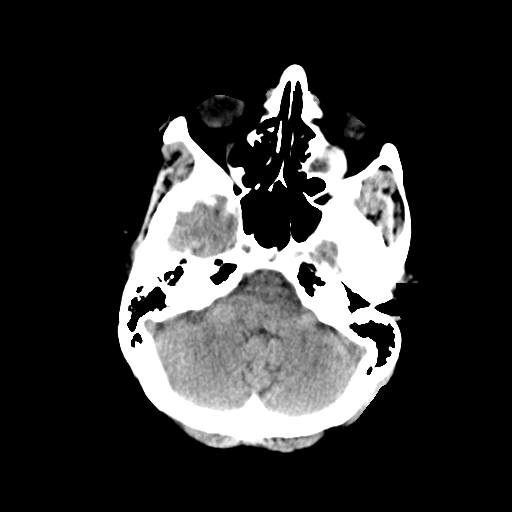
[im 13/34  brain]
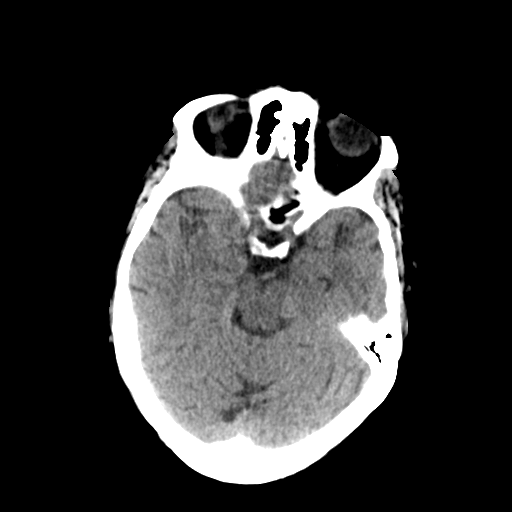
[im 17/34  brain]
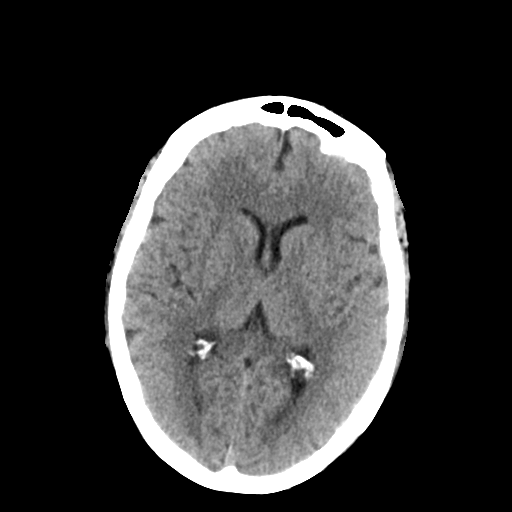
[im 21/34  brain]
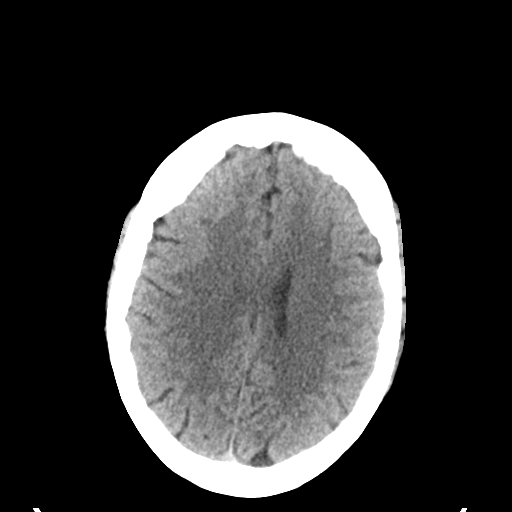
[im 21/34  bone]
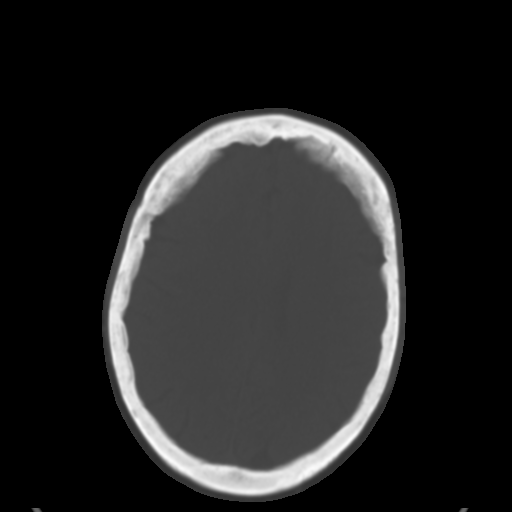
[im 25/34  brain]
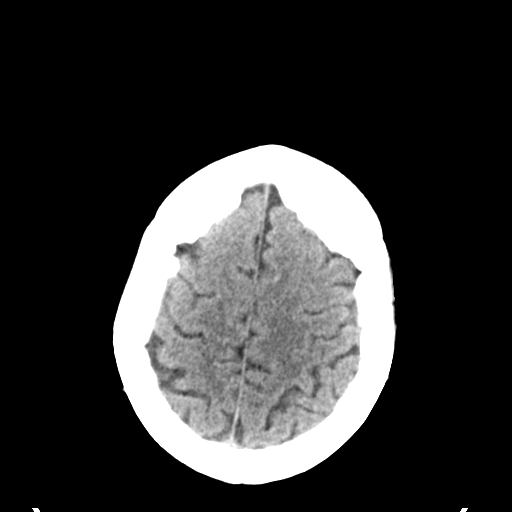
[im 29/34  brain]
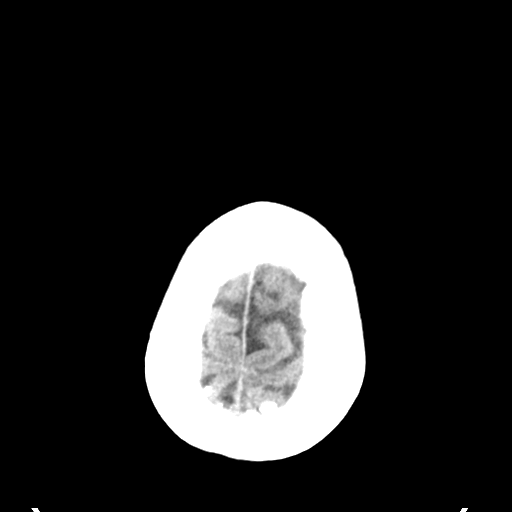

[Series 4: head bone · axial · 0.46mm/px · z∈[-165,-133]mm · 3 of 83 slices shown]
[im 9/83  bone]
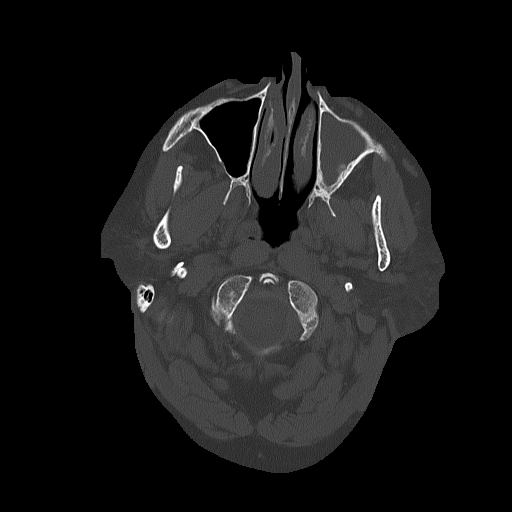
[im 17/83  bone]
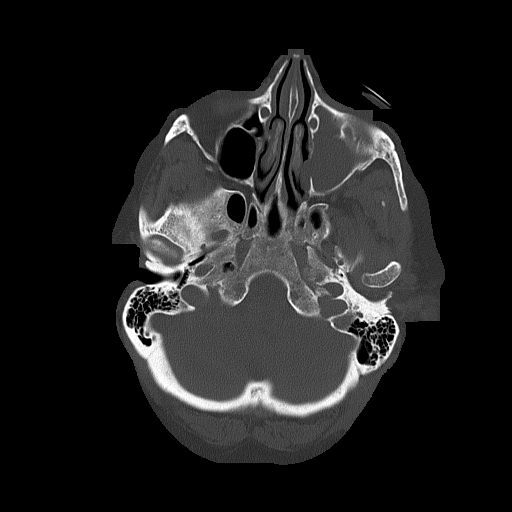
[im 25/83  bone]
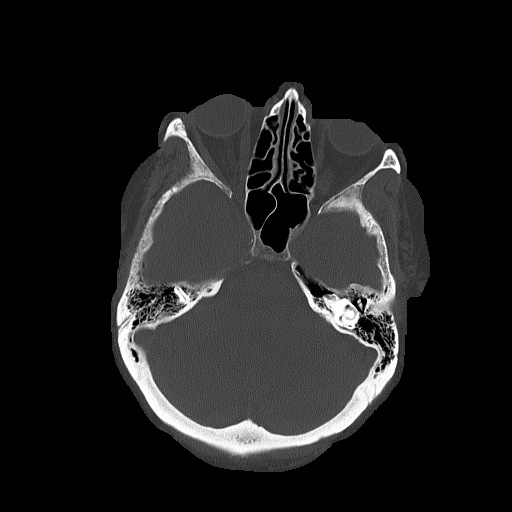

[Series 5: head without cor · coronal · non-contrast · 0.32mm/px · 3 of 74 slices shown]
[im 25/74  brain]
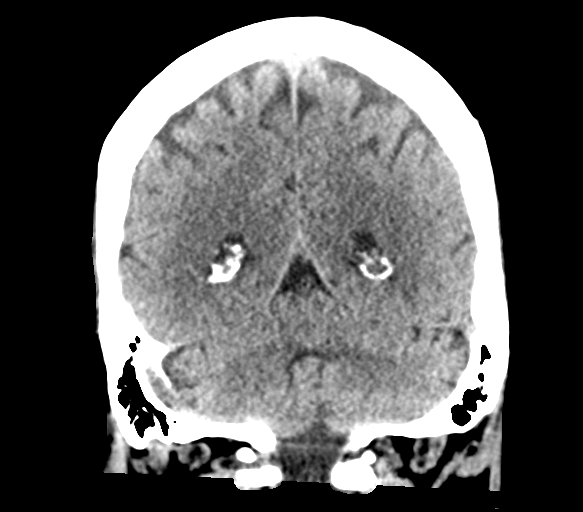
[im 33/74  brain]
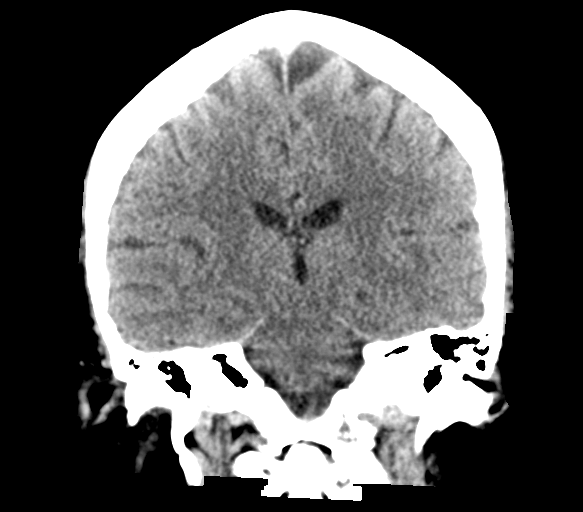
[im 41/74  brain]
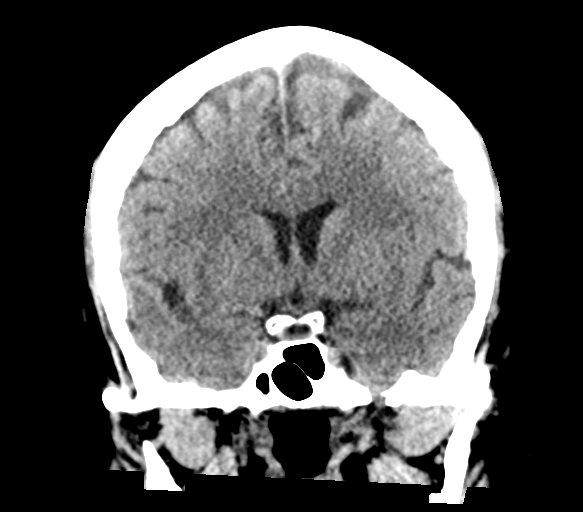

[Series 6: head without sag · sagittal · non-contrast · 0.34mm/px · 3 of 67 slices shown]
[im 23/67  brain]
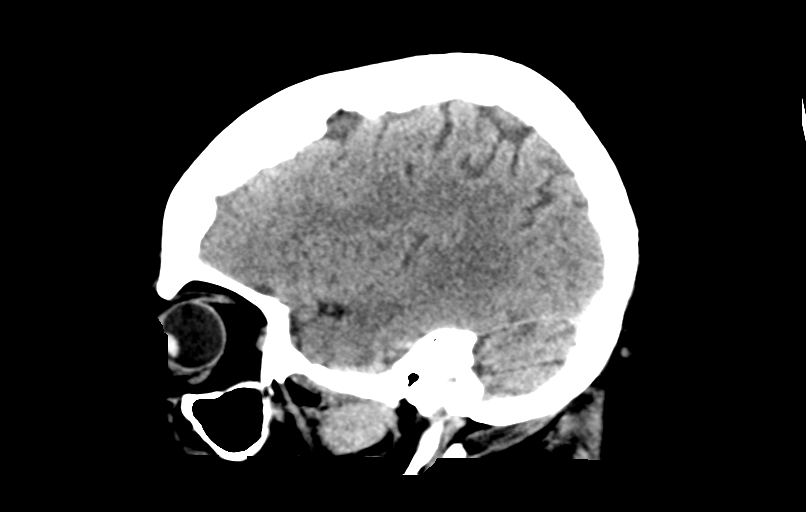
[im 34/67  brain]
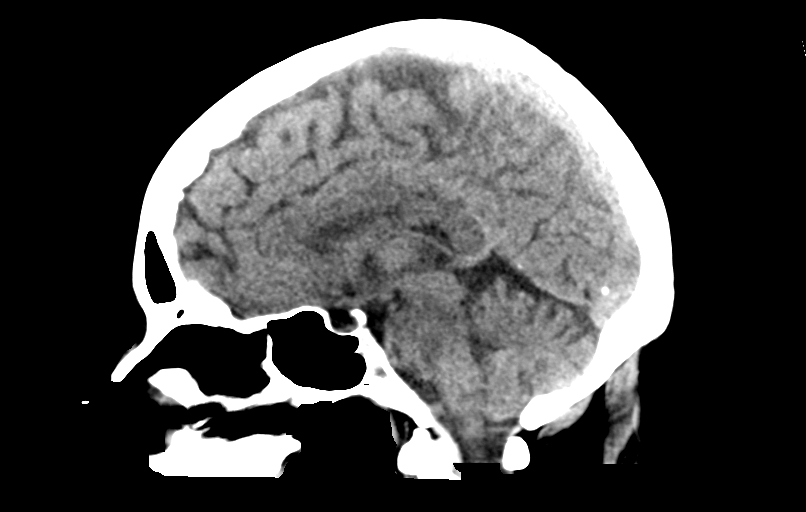
[im 45/67  brain]
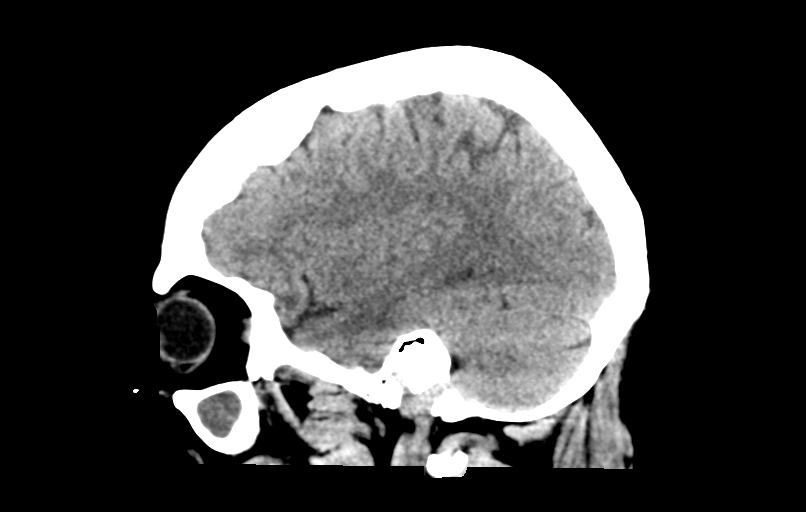

[16 of 47 positions shown; findings below may reference images not displayed]

FINDINGS: Brain: There is no evidence of an acute infarct, intracranial
hemorrhage, mass, midline shift, or extra-axial fluid collection.
The ventricles and sulci are normal.

Vascular: Calcified atherosclerosis at the skull base. No hyperdense
vessel.

Skull: No fracture or suspicious osseous lesion.

Sinuses/Orbits: Persistent complete opacification of the left
maxillary sinus with evidence of chronic sinusitis as well as a
small amount of nodular soft tissue extending into the left nasal
cavity which may reflect a polyp. Clear mastoid air cells.
Unremarkable orbits.

Other: None.
IMPRESSION: No evidence of acute intracranial abnormality.
